# Patient Record
Sex: Male | Born: 1960
Health system: Southern US, Community
[De-identification: ages and names within clinical notes are randomized; demographics above are authoritative.]

## PROBLEM LIST (undated history)

## (undated) DIAGNOSIS — E119 Type 2 diabetes mellitus without complications: Secondary | ICD-10-CM

## (undated) DIAGNOSIS — E785 Hyperlipidemia, unspecified: Secondary | ICD-10-CM

## (undated) DIAGNOSIS — K921 Melena: Secondary | ICD-10-CM

## (undated) DIAGNOSIS — I82409 Acute embolism and thrombosis of unspecified deep veins of unspecified lower extremity: Secondary | ICD-10-CM

## (undated) HISTORY — PX: POLYPECTOMY: SHX149

## (undated) HISTORY — DX: Hyperlipidemia, unspecified: E78.5

## (undated) HISTORY — DX: Type 2 diabetes mellitus without complications: E11.9

## (undated) HISTORY — DX: Acute embolism and thrombosis of unspecified deep veins of unspecified lower extremity: I82.409

## (undated) HISTORY — DX: Melena: K92.1

---

## 2016-10-22 ENCOUNTER — Encounter: Payer: Self-pay | Admitting: Family Medicine

## 2016-10-22 ENCOUNTER — Ambulatory Visit (INDEPENDENT_AMBULATORY_CARE_PROVIDER_SITE_OTHER): Payer: BLUE CROSS/BLUE SHIELD | Admitting: Family Medicine

## 2016-10-22 VITALS — BP 134/79 | HR 84 | Temp 97.5°F | Ht 73.0 in | Wt 250.4 lb

## 2016-10-22 DIAGNOSIS — Z1383 Encounter for screening for respiratory disorder NEC: Secondary | ICD-10-CM

## 2016-10-22 DIAGNOSIS — R739 Hyperglycemia, unspecified: Secondary | ICD-10-CM

## 2016-10-22 DIAGNOSIS — Z125 Encounter for screening for malignant neoplasm of prostate: Secondary | ICD-10-CM

## 2016-10-22 DIAGNOSIS — Z1329 Encounter for screening for other suspected endocrine disorder: Secondary | ICD-10-CM

## 2016-10-22 DIAGNOSIS — Z0000 Encounter for general adult medical examination without abnormal findings: Secondary | ICD-10-CM

## 2016-10-22 DIAGNOSIS — Z136 Encounter for screening for cardiovascular disorders: Secondary | ICD-10-CM | POA: Diagnosis not present

## 2016-10-22 DIAGNOSIS — Z130 Encounter for screening for diseases of the blood and blood-forming organs and certain disorders involving the immune mechanism: Secondary | ICD-10-CM

## 2016-10-22 DIAGNOSIS — Z113 Encounter for screening for infections with a predominantly sexual mode of transmission: Secondary | ICD-10-CM

## 2016-10-22 DIAGNOSIS — Z1211 Encounter for screening for malignant neoplasm of colon: Secondary | ICD-10-CM | POA: Diagnosis not present

## 2016-10-22 DIAGNOSIS — Z23 Encounter for immunization: Secondary | ICD-10-CM | POA: Diagnosis not present

## 2016-10-22 DIAGNOSIS — E785 Hyperlipidemia, unspecified: Secondary | ICD-10-CM

## 2016-10-22 DIAGNOSIS — Z1389 Encounter for screening for other disorder: Secondary | ICD-10-CM

## 2016-10-22 DIAGNOSIS — Z1212 Encounter for screening for malignant neoplasm of rectum: Secondary | ICD-10-CM

## 2016-10-22 LAB — POCT URINALYSIS DIP (MANUAL ENTRY)
BILIRUBIN UA: NEGATIVE
BILIRUBIN UA: NEGATIVE
GLUCOSE UA: NEGATIVE
Leukocytes, UA: NEGATIVE
Nitrite, UA: NEGATIVE
Protein Ur, POC: NEGATIVE
RBC UA: NEGATIVE
Urobilinogen, UA: 0.2
pH, UA: 5.5

## 2016-10-22 NOTE — Progress Notes (Signed)
Subjective:  By signing my name below, I, Cameron James, attest that this documentation has been prepared under the direction and in the presence of Delman Cheadle, MD Electronically Signed: Ladene Artist, ED Scribe 10/22/2016 at 8:21 AM.   Patient ID: Cameron James, male    DOB: 1961/09/18, 57 y.o.   MRN: [id number]   Chief Complaint  Patient presents with  . Annual Exam    CPE   HPI HPI Comments: Cameron James is a 56 y.o. male, with no pertinent family hx, who presents to Primary Care at [facility] for an annual exam. Pt does not take any dietary supplements or vitamins. He does not have any dietary restrictions. Pt last had his cholesterol checked ~20 years ago and states that it was slightly elevated at that time. His mother has a h/o DM and HTN. No family h/o CA. Pt denies h/o tobacco use. Pt denies urinary frequency or weakened stream. Pt is a truck driver. Pt has never had a colonoscopy or cologuard done but agrees to schedule a colonoscopy. His last tetanus is unknown. Pt agrees to STD screening but states no risk.   History reviewed. No pertinent past medical history.   No current outpatient prescriptions on file prior to visit.   No current facility-administered medications on file prior to visit.    Allergies not on file   There is no immunization history on file for this patient.  History reviewed. No pertinent surgical history. Family History  Problem Relation Age of Onset  . Diabetes Mother   . Hypertension Mother   . Heart disease Father     Social History   Social History  . Marital status: Married    Spouse name: N/A  . Number of children: N/A  . Years of education: N/A   Occupational History  . Not on file.   Social History Main Topics  . Smoking status: Never Smoker  . Smokeless tobacco: Never Used  . Alcohol use 1.2 oz/week    1 Cans of beer, 1 Shots of liquor per week  . Drug use: No  . Sexual activity: Not on file   Other Topics Concern  . Not on file     Social History Narrative  . No narrative on file   Review of Systems  Genitourinary: Negative for difficulty urinating and frequency.  All other systems reviewed and are negative.     Objective:   Physical Exam  Constitutional: He is oriented to person, place, and time. He appears well-developed and well-nourished. No distress.  HENT:  Head: Normocephalic and atraumatic.  Right Ear: Tympanic membrane normal.  Left Ear: Tympanic membrane normal.  Nose: Nose normal.  Mouth/Throat: Oropharynx is clear and moist.  Eyes: Conjunctivae and EOM are normal.  Neck: Neck supple. No tracheal deviation present. No thyromegaly present.  Cardiovascular: Normal rate, regular rhythm, S1 normal, S2 normal and normal heart sounds.   Pulmonary/Chest: Effort normal and breath sounds normal. No respiratory distress.  Abdominal: Bowel sounds are normal. He exhibits no mass. There is no hepatosplenomegaly.  Genitourinary:  Genitourinary Comments: Chaperone present. Normal prostate. Normal rectum.   Musculoskeletal: Normal range of motion.  Soft subcutaneous well-defined fluctuant mass ~3 cm on the medial mid aspect of L calf. Nonmonbile. Not adherent to overlying skin. Negative straight leg raise.   Neurological: He is alert and oriented to person, place, and time.  Reflex Scores:      Patellar reflexes are 2+ on the right side and 2+ on the  left side.      Achilles reflexes are 2+ on the right side and 2+ on the left side. Skin: Skin is warm and dry.  Psychiatric: He has a normal mood and affect. His behavior is normal.  Nursing note and vitals reviewed.   BP 134/79 (BP Location: Right Arm, Patient Position: Sitting, Cuff Size: Large)   Pulse 84   Temp 97.5 F (36.4 C) (Oral)   Ht 6\' 1"  (1.854 m)   Wt 250 lb 6.4 oz (113.6 kg)   SpO2 96%   BMI 33.04 kg/m     Assessment & Plan:   1. Annual physical exam   2. Routine screening for STI (sexually transmitted infection)   3. Screening for  cardiovascular, respiratory, and genitourinary diseases   4. Screening for colorectal cancer   5. Screening for deficiency anemia   6. Screening for prostate cancer   7. Screening for thyroid disorder   8. Need for Tdap vaccination   Refuses flu shot.  Orders Placed This Encounter  Procedures  . GC/Chlamydia Probe Amp  . Tdap vaccine greater than or equal to 7yo IM  . CBC  . Comprehensive metabolic panel    Order Specific Question:   Has the patient fasted?    Answer:   Yes  . PSA  . TSH  . Lipid panel    Order Specific Question:   Has the patient fasted?    Answer:   Yes  . HIV antibody  . RPR  . Hepatitis c antibody (reflex)  . HCV Comment:  . Care order/instruction:    AVS - please print after vaccine is given and let pt go  . POCT urinalysis dipstick      I personally performed the services described in this documentation, which was scribed in my presence. The recorded information has been reviewed and considered, and addended by me as needed.   Delman Cheadle, M.D.  [facility] [address] [place], [place] [address] [phone] phone [phone] fax  10/23/16 10:34 AM

## 2016-10-22 NOTE — Patient Instructions (Signed)

## 2016-10-24 ENCOUNTER — Encounter: Payer: Self-pay | Admitting: Gastroenterology

## 2016-10-24 LAB — GC/CHLAMYDIA PROBE AMP
CHLAMYDIA, DNA PROBE: NEGATIVE
NEISSERIA GONORRHOEAE BY PCR: NEGATIVE

## 2016-10-25 LAB — CBC
Hematocrit: 46.3 % (ref 37.5–51.0)
Hemoglobin: 16.4 g/dL (ref 13.0–17.7)
MCH: 30.3 pg (ref 26.6–33.0)
MCHC: 35.4 g/dL (ref 31.5–35.7)
MCV: 86 fL (ref 79–97)
PLATELETS: 195 10*3/uL (ref 150–379)
RBC: 5.41 x10E6/uL (ref 4.14–5.80)
RDW: 13.3 % (ref 12.3–15.4)
WBC: 7.6 10*3/uL (ref 3.4–10.8)

## 2016-10-25 LAB — TSH: TSH: 3.78 u[IU]/mL (ref 0.450–4.500)

## 2016-10-25 LAB — COMPREHENSIVE METABOLIC PANEL

## 2016-10-25 LAB — HCV COMMENT:

## 2016-10-25 LAB — LIPID PANEL

## 2016-10-25 LAB — PSA: PROSTATE SPECIFIC AG, SERUM: 1.4 ng/mL (ref 0.0–4.0)

## 2016-10-25 LAB — RPR: RPR: NONREACTIVE

## 2016-10-25 LAB — HEPATITIS C ANTIBODY (REFLEX)

## 2016-10-25 LAB — HIV ANTIBODY (ROUTINE TESTING W REFLEX): HIV Screen 4th Generation wRfx: NONREACTIVE

## 2016-10-29 LAB — COMPREHENSIVE METABOLIC PANEL
A/G RATIO: 1.5 (ref 1.2–2.2)
ALBUMIN: 4.1 g/dL (ref 3.5–5.5)
ALT: 37 IU/L (ref 0–44)
AST: 22 IU/L (ref 0–40)
Alkaline Phosphatase: 70 IU/L (ref 39–117)
BILIRUBIN TOTAL: 0.3 mg/dL (ref 0.0–1.2)
BUN / CREAT RATIO: 15 (ref 9–20)
BUN: 17 mg/dL (ref 6–24)
CALCIUM: 9.2 mg/dL (ref 8.7–10.2)
CHLORIDE: 102 mmol/L (ref 96–106)
CO2: 24 mmol/L (ref 18–29)
Creatinine, Ser: 1.11 mg/dL (ref 0.76–1.27)
GFR, EST AFRICAN AMERICAN: 87 mL/min/{1.73_m2} (ref >59)
GFR, EST NON AFRICAN AMERICAN: 75 mL/min/{1.73_m2} (ref >59)
GLOBULIN, TOTAL: 2.8 g/dL (ref 1.5–4.5)
Glucose: 174 mg/dL — ABNORMAL HIGH (ref 65–99)
POTASSIUM: 4 mmol/L (ref 3.5–5.2)
SODIUM: 142 mmol/L (ref 134–144)
TOTAL PROTEIN: 6.9 g/dL (ref 6.0–8.5)

## 2016-10-29 LAB — LIPID PANEL
CHOL/HDL RATIO: 7.6 ratio — AB (ref 0.0–5.0)
Cholesterol, Total: 289 mg/dL — ABNORMAL HIGH (ref 100–199)
HDL: 38 mg/dL — ABNORMAL LOW (ref >39)
LDL Calculated: 214 mg/dL — ABNORMAL HIGH (ref 0–99)
Triglycerides: 187 mg/dL — ABNORMAL HIGH (ref 0–149)
VLDL Cholesterol Cal: 37 mg/dL (ref 5–40)

## 2016-11-15 ENCOUNTER — Telehealth: Payer: Self-pay

## 2016-11-15 MED ORDER — PRAVASTATIN SODIUM 40 MG PO TABS: 40.0000 mg | ORAL_TABLET | Freq: Every day | ORAL | 0 refills | Status: DC

## 2016-11-15 NOTE — Telephone Encounter (Signed)
PATIENT STATES HE WAS HERE ABOUT 3 WEEKS AGO AND HE HAD A COMPLETE PHYSICAL DONE WITH DR. SHAW. SOMEONE CALLED HIM TO SAY HE NEEDED TO COME BACK IN TO GIVE MORE BLOOD. HE DID THAT BUT HE HAS NEVER GOTTEN HIS LAB RESULTS. BEST PHONE [phone] (CELL) PHARMACY CHOICE IS [facility] [place]. [place]

## 2016-11-15 NOTE — Addendum Note (Signed)
Addended by: Delman Cheadle on: 11/15/2016 09:32 PM   Modules accepted: Orders

## 2016-11-15 NOTE — Telephone Encounter (Signed)
Please see labs, all resulted? And advise

## 2016-11-15 NOTE — Telephone Encounter (Signed)
Was he fasting since midnight the night prior to his second blood draw?    His blood counts, prostate, and thyroid blood tests were all normal. He does not have HIV, syphilis, hepatitis C, chlamydia, or gonorrhea.  However, the lab for some reason cancelled his first blood sugar and cholesterol and his second - at the redraw - were much to high. Needs to start on cholesterol medication - pravastatin - which I sent to his pharmacy and come back in for an OV asap. We will be able to get the diabetes test back during his office visit and discuss details at that time.

## 2016-11-16 ENCOUNTER — Telehealth: Payer: Self-pay

## 2016-11-16 NOTE — Telephone Encounter (Signed)
[facility] [place], [place]

## 2016-11-16 NOTE — Telephone Encounter (Signed)
Pt calling in about lab results. Relayed msg from Dr Brigitte Pulse. Wants more clarification on why he has an rx called in as well as his results. [facility] [place] as he is traveling/working as a Administrator.

## 2016-11-18 NOTE — Telephone Encounter (Signed)
lmtcb for appt or response

## 2016-11-19 ENCOUNTER — Ambulatory Visit (INDEPENDENT_AMBULATORY_CARE_PROVIDER_SITE_OTHER): Payer: BLUE CROSS/BLUE SHIELD | Admitting: Family Medicine

## 2016-11-19 VITALS — BP 132/92 | HR 88 | Temp 98.3°F | Resp 16 | Ht 73.0 in | Wt 247.0 lb

## 2016-11-19 DIAGNOSIS — E119 Type 2 diabetes mellitus without complications: Secondary | ICD-10-CM

## 2016-11-19 DIAGNOSIS — E785 Hyperlipidemia, unspecified: Secondary | ICD-10-CM

## 2016-11-19 DIAGNOSIS — R7309 Other abnormal glucose: Secondary | ICD-10-CM

## 2016-11-19 DIAGNOSIS — Z5181 Encounter for therapeutic drug level monitoring: Secondary | ICD-10-CM

## 2016-11-19 LAB — POCT GLYCOSYLATED HEMOGLOBIN (HGB A1C): Hemoglobin A1C: 7.2

## 2016-11-19 MED ORDER — BLOOD GLUCOSE MONITOR KIT: PACK | 0 refills | Status: DC

## 2016-11-19 MED ORDER — ASPIRIN EC 81 MG PO TBEC: 81.0000 mg | DELAYED_RELEASE_TABLET | Freq: Every day | ORAL | Status: DC

## 2016-11-19 MED ORDER — METFORMIN HCL 500 MG PO TABS: 500.0000 mg | ORAL_TABLET | Freq: Every day | ORAL | 3 refills | Status: DC

## 2016-11-19 NOTE — Patient Instructions (Addendum)
IF you received an x-ray today, you will receive an invoice from [facility]. Please contact [facility] at [phone] with questions or concerns regarding your invoice.   IF you received labwork today, you will receive an invoice from [place]. Please contact [facility] at [phone] with questions or concerns regarding your invoice.   Our billing staff will not be able to assist you with questions regarding bills from these companies.  You will be contacted with the lab results as soon as they are available. The fastest way to get your results is to activate your My Chart account. Instructions are located on the last page of this paperwork. If you have not heard from [place] regarding the results in 2 weeks, please contact this office.     Type 2 Diabetes Mellitus, Diagnosis, Adult Type 2 diabetes (type 2 diabetes mellitus) is a long-term (chronic) disease. In type 2 diabetes, one or both of these problems may be present:  The pancreas does not make enough of a hormone called insulin.  Cells in the body do not respond properly to insulin that the body makes (insulin resistance). Normally, insulin allows blood sugar (glucose) to enter cells in the body. The cells use glucose for energy. Insulin resistance or lack of insulin causes excess glucose to build up in the blood instead of going into cells. As a result, high blood glucose (hyperglycemia) develops. What increases the risk? The following factors may make you more likely to develop type 2 diabetes:  Having a family member with type 2 diabetes.  Being overweight or obese.  Having an inactive (sedentary) lifestyle.  Having been diagnosed with insulin resistance.  Having a history of prediabetes, gestational diabetes, or polycystic ovarian syndrome (PCOS).  Being of American-Indian, African-American, Hispanic/Latino, or Asian/Pacific Islander descent. What are the signs or symptoms? In the early stage of this  condition, you may not have symptoms. Symptoms develop slowly and may include:  Increased thirst (polydipsia).  Increased hunger(polyphagia).  Increased urination (polyuria).  Increased urination during the night (nocturia).  Unexplained weight loss.  Frequent infections that keep coming back (recurring).  Fatigue.  Weakness.  Vision changes, such as blurry vision.  Cuts or bruises that are slow to heal.  Tingling or numbness in the hands or feet.  Dark patches on the skin (acanthosis nigricans). How is this diagnosed?   This condition is diagnosed based on your symptoms, your medical history, a physical exam, and your blood glucose level. Your blood glucose may be checked with one or more of the following blood tests:  A fasting blood glucose (FBG) test. You will not be allowed to eat (you will fast) for at least 8 hours before a blood sample is taken.  A random blood glucose test. This checks blood glucose at any time of day regardless of when you ate.  An A1c (hemoglobin A1c) blood test. This provides information about blood glucose control over the previous 2-3 months.  An oral glucose tolerance test (OGTT). This measures your blood glucose at two times:  After fasting. This is your baseline blood glucose level.  Two hours after drinking a beverage that contains glucose. You may be diagnosed with type 2 diabetes if:  Your FBG level is 126 mg/dL (7.0 mmol/L) or higher.  Your random blood glucose level is 200 mg/dL (11.1 mmol/L) or higher.  Your A1c level is 6.5% or higher.  Your OGGT result is higher than 200 mg/dL (11.1 mmol/L). These blood tests may be repeated  to confirm your diagnosis. How is this treated? Your treatment may be managed by a specialist called an endocrinologist. Type 2 diabetes may be treated by following instructions from your health care provider about:  Making diet and lifestyle changes. This may include:  Following an individualized  nutrition plan that is developed by a diet and nutrition specialist (registered dietitian).  Exercising regularly.  Finding ways to manage stress.  Checking your blood glucose level as often as directed.  Taking diabetes medicines or insulin daily. This helps to keep your blood glucose levels in the healthy range.  If you use insulin, you may need to adjust the dosage depending on how physically active you are and what foods you eat. Your health care provider will tell you how to adjust your dosage.  Taking medicines to help prevent complications from diabetes, such as:  Aspirin.  Medicine to lower cholesterol.  Medicine to control blood pressure. Your health care provider will set individualized treatment goals for you. Your goals will be based on your age, other medical conditions you have, and how you respond to diabetes treatment. Generally, the goal of treatment is to maintain the following blood glucose levels:  Before meals (preprandial): 80-130 mg/dL (4.4-7.2 mmol/L).  After meals (postprandial): below 180 mg/dL (10 mmol/L).  A1c level: less than 7%. Follow these instructions at home: Questions to [place] Provider  Consider asking the following questions:  Do I need to meet with a diabetes educator?  Where can I find a support group for people with diabetes?  What equipment will I need to manage my diabetes at home?  What diabetes medicines do I need, and when should I take them?  How often do I need to check my blood glucose?  What number can I call if I have questions?  When is my next appointment? General instructions  Take over-the-counter and prescription medicines only as told by your health care provider.  Keep all follow-up visits as told by your health care provider. This is important.  For more information about diabetes, visit:  American Diabetes Association (ADA): www.diabetes.org  American Association of Diabetes Educators (AADE):  www.diabeteseducator.org/patient-resources Contact a health care provider if:  Your blood glucose is at or above 240 mg/dL (13.3 mmol/L) for 2 days in a row.  You have been sick or have had a fever for 2 days or longer and you are not getting better.  You have any of the following problems for more than 6 hours:  You cannot eat or drink.  You have nausea and vomiting.  You have diarrhea. Get help right away if:  Your blood glucose is lower than 54 mg/dL (3.0 mmol/L).  You become confused or you have trouble thinking clearly.  You have difficulty breathing.  You have moderate or large ketone levels in your urine. This information is not intended to replace advice given to you by your health care provider. Make sure you discuss any questions you have with your health care provider. Document Released: 09/30/2005 Document Revised: 03/07/2016 Document Reviewed: 11/03/2015 Elsevier Interactive Patient Education  2017 Elsevier Inc. Diabetes Mellitus and Food It is important for you to manage your blood sugar (glucose) level. Your blood glucose level can be greatly affected by what you eat. Eating healthier foods in the appropriate amounts throughout the day at about the same time each day will help you control your blood glucose level. It can also help slow or prevent worsening of your diabetes mellitus. Healthy eating  may even help you improve the level of your blood pressure and reach or maintain a healthy weight. General recommendations for healthful eating and cooking habits include:  Eating meals and snacks regularly. Avoid going long periods of time without eating to lose weight.  Eating a diet that consists mainly of plant-based foods, such as fruits, vegetables, nuts, legumes, and whole grains.  Using low-heat cooking methods, such as baking, instead of high-heat cooking methods, such as deep frying. Work with your dietitian to make sure you understand how to use the Nutrition  Facts information on food labels. How can food affect me? Carbohydrates  Carbohydrates affect your blood glucose level more than any other type of food. Your dietitian will help you determine how many carbohydrates to eat at each meal and teach you how to count carbohydrates. Counting carbohydrates is important to keep your blood glucose at a healthy level, especially if you are using insulin or taking certain medicines for diabetes mellitus. Alcohol  Alcohol can cause sudden decreases in blood glucose (hypoglycemia), especially if you use insulin or take certain medicines for diabetes mellitus. Hypoglycemia can be a life-threatening condition. Symptoms of hypoglycemia (sleepiness, dizziness, and disorientation) are similar to symptoms of having too much alcohol. If your health care provider has given you approval to drink alcohol, do so in moderation and use the following guidelines:  Women should not have more than one drink per day, and men should not have more than two drinks per day. One drink is equal to:  12 oz of beer.  5 oz of wine.  1 oz of hard liquor.  Do not drink on an empty stomach.  Keep yourself hydrated. Have water, diet soda, or unsweetened iced tea.  Regular soda, juice, and other mixers might contain a lot of carbohydrates and should be counted. What foods are not recommended? As you make food choices, it is important to remember that all foods are not the same. Some foods have fewer nutrients per serving than other foods, even though they might have the same number of calories or carbohydrates. It is difficult to get your body what it needs when you eat foods with fewer nutrients. Examples of foods that you should avoid that are high in calories and carbohydrates but low in nutrients include:  Trans fats (most processed foods list trans fats on the Nutrition Facts label).  Regular soda.  Juice.  Candy.  Sweets, such as cake, pie, doughnuts, and cookies.  Fried  foods. What foods can I eat? Eat nutrient-rich foods, which will nourish your body and keep you healthy. The food you should eat also will depend on several factors, including:  The calories you need.  The medicines you take.  Your weight.  Your blood glucose level.  Your blood pressure level.  Your cholesterol level. You should eat a variety of foods, including:  Protein.  Lean cuts of meat.  Proteins low in saturated fats, such as fish, egg whites, and beans. Avoid processed meats.  Fruits and vegetables.  Fruits and vegetables that may help control blood glucose levels, such as apples, mangoes, and yams.  Dairy products.  Choose fat-free or low-fat dairy products, such as milk, yogurt, and cheese.  Grains, bread, pasta, and rice.  Choose whole grain products, such as multigrain bread, whole oats, and brown rice. These foods may help control blood pressure.  Fats.  Foods containing healthful fats, such as nuts, avocado, olive oil, canola oil, and fish. Does everyone with diabetes mellitus  have the same meal plan? Because every person with diabetes mellitus is different, there is not one meal plan that works for everyone. It is very important that you meet with a dietitian who will help you create a meal plan that is just right for you. This information is not intended to replace advice given to you by your health care provider. Make sure you discuss any questions you have with your health care provider. Document Released: 06/27/2005 Document Revised: 03/07/2016 Document Reviewed: 08/27/2013 Elsevier Interactive Patient Education  2017 [place] for Eating Away From Home If You Have Diabetes Controlling your level of blood glucose, also known as blood sugar, can be challenging. It can be even more difficult when you do not prepare your own meals. The following tips can help you manage your diabetes when you eat away from home. Planning ahead Plan ahead if  you know you will be eating away from home:  Ask your health care provider how to time meals and medicine if you are taking insulin.  Make a list of restaurants near you that offer healthy choices. If they have a carry-out menu, take it home and plan what you will order ahead of time.  Look up the restaurant you want to eat at online. Many chain and fast-food restaurants list nutritional information online. Use this information to choose the healthiest options and to calculate how many carbohydrates will be in your meal.  Use a carbohydrate-counting book or mobile app to look up the carbohydrate content and serving size of the foods you want to eat.  Become familiar with serving sizes and learn to recognize how many servings are in a portion. This will allow you to estimate how many carbohydrates you can eat. Free foods A "free food" is any food or drink that has less than 5 g of carbohydrates per serving. Free foods include:  Many vegetables.  Hard boiled eggs.  Nuts or seeds.  Olives.  Cheeses.  Meats. These types of foods make good appetizer choices and are often available at salad bars. Lemon juice, vinegar, or a low-calorie salad dressing of fewer than 20 calories per serving can be used as a "free" salad dressing. Choices to reduce carbohydrates  Substitute nonfat sweetened yogurt with a sugar-free yogurt. Yogurt made from soy milk may also be used, but you will still want a sugar-free or plain option to choose a lower carbohydrate amount.  Ask your server to take away the bread basket or chips from your table.  Order fresh fruit. A salad bar often offers fresh fruit choices. Avoid canned fruit because it is usually packed in sugar or syrup.  Order a salad, and eat it without dressing. Or, create a "free" salad dressing.  Ask for substitutions. For example, instead of [place] fries, request an order of a vegetable such as salad, green beans, or broccoli. Other tips  If you  take insulin, take the insulin once your food arrives to your table. This will ensure your insulin and food are timed correctly.  Ask your server about the portion size before your order, and ask for a take-out box if the portion has more servings than you should have. When your food comes, leave the amount you should have on the plate, and put the rest in the take-out box.  Consider splitting an entree with someone and ordering a side salad. This information is not intended to replace advice given to you by your health care provider. Make sure  you discuss any questions you have with your health care provider. Document Released: 09/30/2005 Document Revised: 03/07/2016 Document Reviewed: 12/28/2013 Elsevier Interactive Patient Education  2017 Elsevier Inc.  Preventing Type 2 Diabetes Mellitus Type 2 diabetes (type 2 diabetes mellitus) is a long-term (chronic) disease that affects blood sugar (glucose) levels. Normally, a hormone called insulin allows glucose to enter cells in the body. The cells use glucose for energy. In type 2 diabetes, one or both of these problems may be present:  The body does not make enough insulin.  The body does not respond properly to insulin that it makes (insulin resistance). Insulin resistance or lack of insulin causes excess glucose to build up in the blood instead of going into cells. As a result, high blood glucose (hyperglycemia) develops, which can cause many complications. Being overweight or obese and having an inactive (sedentary) lifestyle can increase your risk for diabetes. Type 2 diabetes can be delayed or prevented by making certain nutrition and lifestyle changes. What nutrition changes can be made?  Eat healthy meals and snacks regularly. Keep a healthy snack with you for when you get hungry between meals, such as fruit or a handful of nuts.  Eat lean meats and proteins that are low in saturated fats, such as chicken, fish, egg whites, and beans. Avoid  processed meats.  Eat plenty of fruits and vegetables and plenty of grains that have not been processed (whole grains). It is recommended that you eat:  1?2 cups of fruit every day.  2?3 cups of vegetables every day.  6?8 oz of whole grains every day, such as oats, whole wheat, bulgur, brown rice, quinoa, and millet.  Eat low-fat dairy products, such as milk, yogurt, and cheese.  Eat foods that contain healthy fats, such as nuts, avocado, olive oil, and canola oil.  Drink water throughout the day. Avoid drinks that contain added sugar, such as soda or sweet tea.  Follow instructions from your health care provider about specific eating or drinking restrictions.  Control how much food you eat at a time (portion size).  Check food labels to find out the serving sizes of foods.  Use a kitchen scale to weigh amounts of foods.  Saute or steam food instead of frying it. Cook with water or broth instead of oils or butter.  Limit your intake of:  Salt (sodium). Have no more than 1 tsp (2,400 mg) of sodium a day. If you have heart disease or high blood pressure, have less than ? tsp (1,500 mg) of sodium a day.  Saturated fat. This is fat that is solid at room temperature, such as butter or fat on meat. What lifestyle changes can be made?  Activity  Do moderate-intensity physical activity for at least 30 minutes on at least 5 days of the week, or as much as told by your health care provider.  Ask your health care provider what activities are safe for you. A mix of physical activities may be best, such as walking, swimming, cycling, and strength training.  Try to add physical activity into your day. For example:  Park in spots that are farther away than usual, so that you walk more. For example, park in a far corner of the parking lot when you go to the office or the grocery store.  Take a walk during your lunch break.  Use stairs instead of elevators or escalators. Weight  Loss  Lose weight as directed. Your health care provider can determine how much weight  loss is best for you and can help you lose weight safely.  If you are overweight or obese, you may be instructed to lose at least 5?7 % of your body weight. Alcohol and Tobacco   Limit alcohol intake to no more than 1 drink a day for nonpregnant women and 2 drinks a day for men. One drink equals 12 oz of beer, 5 oz of wine, or 1 oz of hard liquor.  Do not use any tobacco products, such as cigarettes, chewing tobacco, and e-cigarettes. If you need help quitting, ask your health care provider. Work With Your Health Care Provider  Have your blood glucose tested regularly, as told by your health care provider.  Discuss your risk factors and how you can reduce your risk for diabetes.  Get screening tests as told by your health care provider. You may have screening tests regularly, especially if you have certain risk factors for type 2 diabetes.  Make an appointment with a diet and nutrition specialist (registered dietitian). A registered dietitian can help you make a healthy eating plan and can help you understand portion sizes and food labels. Why are these changes important?  It is possible to prevent or delay type 2 diabetes and related health problems by making lifestyle and nutrition changes.  It can be difficult to recognize signs of type 2 diabetes. The best way to avoid possible damage to your body is to take actions to prevent the disease before you develop symptoms. What can happen if changes are not made?  Your blood glucose levels may keep increasing. Having high blood glucose for a long time is dangerous. Too much glucose in your blood can damage your blood vessels, heart, kidneys, nerves, and eyes.  You may develop prediabetes or type 2 diabetes. Type 2 diabetes can lead to many chronic health problems and complications, such as:  Heart disease.  Stroke.  Blindness.  Kidney  disease.  Depression.  Poor circulation in the feet and legs, which could lead to surgical removal (amputation) in severe cases. Where to find support:  Ask your health care provider to recommend a registered dietitian, diabetes educator, or weight loss program.  Look for local or online weight loss groups.  Join a gym, fitness club, or outdoor activity group, such as a walking club. Where to find more information: To learn more about diabetes and diabetes prevention, visit:  American Diabetes Association (ADA): www.diabetes.[employer] of Diabetes and Digestive and Kidney Diseases: ToyArticles.ca To learn more about healthy eating, visit:  The U.S. Department of Agriculture Architect), Choose My Plate: http://yates.biz/  Office of Disease Prevention and Health Promotion (ODPHP), Dietary Guidelines: ListingMagazine.si Summary  You can reduce your risk for type 2 diabetes by increasing your physical activity, eating healthy foods, and losing weight as directed.  Talk with your health care provider about your risk for type 2 diabetes. Ask about any blood tests or screening tests that you need to have. This information is not intended to replace advice given to you by your health care provider. Make sure you discuss any questions you have with your health care provider. Document Released: 01/22/2016 Document Revised: 03/07/2016 Document Reviewed: 11/21/2015 Elsevier Interactive Patient Education  2017 Elsevier Inc.  Diabetes Mellitus and Standards of Medical Care Managing diabetes (diabetes mellitus) can be complicated. Your diabetes treatment may be managed by a team of health care providers, including:  A diet and nutrition specialist (registered dietitian).  A nurse.  A certified diabetes educator (CDE).  A diabetes specialist (endocrinologist).  An eye doctor.  A primary care provider.  A  dentist. Your health care providers follow a schedule in order to help you get the best quality of care. The following schedule is a general guideline for your diabetes management plan. Your health care providers may also give you more specific instructions. HbA1c ( hemoglobin A1c) test This test provides information about blood sugar (glucose) control over the previous 2-3 months. It is used to check whether your diabetes management plan needs to be adjusted.  If you are meeting your treatment goals, this test is done at least 2 times a year.  If you are not meeting treatment goals or if your treatment goals have changed, this test is done 4 times a year. Blood pressure test  This test is done at every routine medical visit. For most people, the goal is less than 140/90. In some cases, your goal blood pressure may be 130/80 or less. Ask your health care provider what your goal blood pressure should be. Dental and eye exams  Visit your dentist two times a year.  If you have type 1 diabetes, get an eye exam 3-5 years after you are diagnosed, and then once a year after your first exam.  If you were diagnosed with type 1 diabetes as a child, get an eye exam when you are age 58 or older and have had diabetes for 3-5 years. After the first exam, you should get an eye exam once a year.  If you have type 2 diabetes, have an eye exam as soon as you are diagnosed, and then once a year after your first exam. Foot care exam  Visual foot exams are done at every routine medical visit. The exams check for cuts, bruises, redness, blisters, sores, or other problems with the feet.  A complete foot exam is done by your health care provider once a year. This exam includes an inspection of the structure and skin of your feet, and a check of the pulses and sensation in your feet.  Type 1 diabetes: Get your first exam 3-5 years after diagnosis.  Type 2 diabetes: Get your first exam as soon as you are  diagnosed.  Check your feet every day for cuts, bruises, redness, blisters, or sores. If you have any of these or other problems that are not healing, contact your health care provider. Kidney function test ( urine microalbumin)  This test is done once a year.  Type 1 diabetes: Get your first test 5 years after diagnosis.  Type 2 diabetes: Get your first test as soon as you are diagnosed.  If you have chronic kidney disease (CKD), get a serum creatinine and estimated glomerular filtration rate (eGFR) test once a year. Lipid profile (cholesterol, HDL, LDL, triglycerides)  This test should be done when you are diagnosed with diabetes, and every 5 years after the first test. If you are on medicines to lower your cholesterol, you may need to get this test done every year.  The goal for LDL is less than 100 mg/dL (5.5 mmol/L). If you are at high risk, the goal is less than 70 mg/dL (3.9 mmol/L).  The goal for HDL is 40 mg/dL (2.2 mmol/L) for men and 50 mg/dL(2.8 mmol/L) for women. An HDL cholesterol of 60 mg/dL (3.3 mmol/L) or higher gives some protection against heart disease.  The goal for triglycerides is less than 150 mg/dL (8.3 mmol/L). Immunizations  The yearly flu (influenza) vaccine is recommended  for everyone 6 months or older who has diabetes.  The pneumonia (pneumococcal) vaccine is recommended for everyone 2 years or older who has diabetes. If you are 17 or older, you may get the pneumonia vaccine as a series of two separate shots.  The hepatitis B vaccine is recommended for adults shortly after they have been diagnosed with diabetes.  The Tdap (tetanus, diphtheria, and pertussis) vaccine should be given:  According to normal childhood vaccination schedules, for children.  Every 10 years, for adults who have diabetes.  The shingles vaccine is recommended for people who have had chicken pox and are 50 years or older. Mental and emotional health  Screening for symptoms of  eating disorders, anxiety, and depression is recommended at the time of diagnosis and afterward as needed. If your screening shows that you have symptoms (you have a positive screening result), you may need further evaluation and be referred to a mental health care provider. Diabetes self-management education  Education about how to manage your diabetes is recommended at diagnosis and ongoing as needed. Treatment plan  Your treatment plan will be reviewed at every medical visit. Summary  Managing diabetes (diabetes mellitus) can be complicated. Your diabetes treatment may be managed by a team of health care providers.  Your health care providers follow a schedule in order to help you get the best quality of care.  Standards of care including having regular physical exams, blood tests, blood pressure monitoring, immunizations, screening tests, and education about how to manage your diabetes.  Your health care providers may also give you more specific instructions based on your individual health. This information is not intended to replace advice given to you by your health care provider. Make sure you discuss any questions you have with your health care provider. Document Released: 07/28/2009 Document Revised: 06/28/2016 Document Reviewed: 06/28/2016 Elsevier Interactive Patient Education  2017 [employer].   Blood Glucose Monitoring, Adult Monitoring your blood sugar (glucose) helps you manage your diabetes. It also helps you and your health care provider determine how well your diabetes management plan is working. Blood glucose monitoring involves checking your blood glucose as often as directed, and keeping a record (log) of your results over time. Why should I monitor my blood glucose? Checking your blood glucose regularly can:  Help you understand how food, exercise, illnesses, and medicines affect your blood glucose.  Let you know what your blood glucose is at any time. You can  quickly tell if you are having low blood glucose (hypoglycemia) or high blood glucose (hyperglycemia).  Help you and your health care provider adjust your medicines as needed. When should I check my blood glucose? Follow instructions from your health care provider about how often to check your blood glucose. This may depend on:  The type of diabetes you have.  How well-controlled your diabetes is.  Medicines you are taking. If you have type 1 diabetes:  Check your blood glucose at least 2 times a day.  Also check your blood glucose:  Before every insulin injection.  Before and after exercise.  Between meals.  2 hours after a meal.  Occasionally between 2:00 a.m. and 3:00 a.m., as directed.  Before potentially dangerous tasks, like driving or using heavy machinery.  At bedtime.  You may need to check your blood glucose more often, up to 6-10 times a day:  If you use an insulin pump.  If you need multiple daily injections (MDI).  If your diabetes is not well-controlled.  If you are ill.  If you have a history of severe hypoglycemia.  If you have a history of not knowing when your blood glucose is getting low (hypoglycemia unawareness). If you have type 2 diabetes:  If you take insulin or other diabetes medicines, check your blood glucose at least 2 times a day.  If you are on intensive insulin therapy, check your blood glucose at least 4 times a day. Occasionally, you may also need to check between 2:00 a.m. and 3:00 a.m., as directed.  Also check your blood glucose:  Before and after exercise.  Before potentially dangerous tasks, like driving or using heavy machinery.  You may need to check your blood glucose more often if:  Your medicine is being adjusted.  Your diabetes is not well-controlled.  You are ill. What is a blood glucose log?  A blood glucose log is a record of your blood glucose readings. It helps you and your health care provider:  Look  for patterns in your blood glucose over time.  Adjust your diabetes management plan as needed.  Every time you check your blood glucose, write down your result and notes about things that may be affecting your blood glucose, such as your diet and exercise for the day.  Most glucose meters store a record of glucose readings in the meter. Some meters allow you to download your records to a computer. How do I check my blood glucose? Follow these steps to get accurate readings of your blood glucose: Supplies needed   Blood glucose meter.  Test strips for your meter. Each meter has its own strips. You must use the strips that come with your meter.  A needle to prick your finger (lancet). Do not use lancets more than once.  A device that holds the lancet (lancing device).  A journal or log book to write down your results. Procedure  Wash your hands with soap and water.  Prick the side of your finger (not the tip) with the lancet. Use a different finger each time.  Gently rub the finger until a small drop of blood appears.  Follow instructions that come with your meter for inserting the test strip, applying blood to the strip, and using your blood glucose meter.  Write down your result and any notes. Alternative testing sites  Some meters allow you to use areas of your body other than your finger (alternative sites) to test your blood.  If you think you may have hypoglycemia, or if you have hypoglycemia unawareness, do not use alternative sites. Use your finger instead.  Alternative sites may not be as accurate as the fingers, because blood flow is slower in these areas. This means that the result you get may be delayed, and it may be different from the result that you would get from your finger.  The most common alternative sites are:  Forearm.  Thigh.  Palm of the hand. Additional tips  Always keep your supplies with you.  If you have questions or need help, all blood  glucose meters have a 24-hour "hotline" number that you can call. You may also contact your health care provider.  After you use a few boxes of test strips, adjust (calibrate) your blood glucose meter by following instructions that came with your meter. This information is not intended to replace advice given to you by your health care provider. Make sure you discuss any questions you have with your health care provider. Document Released: 10/03/2003 Document Revised: 04/19/2016  Document Reviewed: 03/11/2016 Elsevier Interactive Patient Education  2017 [employer].

## 2016-11-19 NOTE — Progress Notes (Signed)
Subjective:  By signing my name below, I, [name], attest that this documentation has been prepared under the direction and in the presence of Delman Cheadle, MD Electronically Signed: Ladene Artist, ED Scribe 11/19/2016 at 11:05 AM.   Patient ID: Cameron James, male    DOB: [birth year], 56 y.o.   MRN: [id number]  Chief Complaint  Patient presents with  . Follow-up    check A1c   HPI Cameron James is a 56 y.o. male who presents to Primary Care at [facility] for a follow-up. Pt is fasting at this visit. He has started the pravastatin. Pt states that his diet is average when it comes to carbohydrates but is not against cutting back on them. He states that he used to walk a lot for exercise but has stopped, however, he plans to restart exercising. Pt reports a family hx of DM.   No past medical history on file.   Current Outpatient Prescriptions on File Prior to Visit  Medication Sig Dispense Refill  . pravastatin (PRAVACHOL) 40 MG tablet Take 1 tablet (40 mg total) by mouth daily. 90 tablet 0   No current facility-administered medications on file prior to visit.    No Known Allergies  Social History   Social History  . Marital status: Married    Spouse name: N/A  . Number of children: N/A  . Years of education: N/A   Social History Main Topics  . Smoking status: Never Smoker  . Smokeless tobacco: Never Used  . Alcohol use 1.2 oz/week    1 Cans of beer, 1 Shots of liquor per week  . Drug use: No  . Sexual activity: Not Asked   Other Topics Concern  . None   Social History Narrative  . None   Depression screen [facility] 2/9 11/19/2016 10/22/2016  Decreased Interest 0 0  Down, Depressed, Hopeless 0 0  PHQ - 2 Score 0 0    Review of Systems  Constitutional: Negative for activity change, appetite change, chills and fever.  Eyes: Negative for visual disturbance.  Respiratory: Negative for shortness of breath.   Cardiovascular: Negative for chest pain and leg swelling.    Gastrointestinal: Negative for nausea and vomiting.  Endocrine: Negative for polydipsia, polyphagia and polyuria.  Genitourinary: Negative for frequency and urgency.  Neurological: Negative for dizziness, syncope, facial asymmetry, weakness, light-headedness and numbness.      Objective:   Physical Exam  Constitutional: He is oriented to person, place, and time. He appears well-developed and well-nourished. No distress.  HENT:  Head: Normocephalic and atraumatic.  Eyes: Conjunctivae and EOM are normal.  Neck: Neck supple. No tracheal deviation present.  Cardiovascular: Normal rate.   Pulmonary/Chest: Effort normal. No respiratory distress.  Musculoskeletal: Normal range of motion.  Neurological: He is alert and oriented to person, place, and time.  Skin: Skin is warm and dry.  Psychiatric: He has a normal mood and affect. His behavior is normal.  Nursing note and vitals reviewed.  BP (!) 132/92 (BP Location: Right Arm, Patient Position: Standing, Cuff Size: Large)   Pulse 88   Temp 98.3 F (36.8 C) (Oral)   Resp 16   Ht _0  (1.854 m)   Wt 247 lb (112 kg)   SpO2 94%   BMI 32.59 kg/m     Results for orders placed or performed in visit on 11/19/16  POCT glycosylated hemoglobin (Hb A1C)  Result Value Ref Range   Hemoglobin A1C 7.2    Assessment & Plan:  1. Hyperlipidemia, unspecified hyperlipidemia type   2. Elevated glucose   3. Medication monitoring encounter   4. Type 2 diabetes mellitus without complication, without long-term current use of insulin (HCC)   New diagnosis DM today - reviewed how and when to check cbgs, how to interpret #s, diet. Pt declines DM education referral but will call if he changes his mind - I gave him numerous handouts and he plans to use the ADA website and other online tools. Start metformin. Recheck 3 mos.  Orders Placed This Encounter  Procedures  . Comprehensive metabolic panel    Order Specific Question:   Has the patient fasted?     Answer:   Yes  . Lipid panel    Order Specific Question:   Has the patient fasted?    Answer:   Yes  . POCT glycosylated hemoglobin (Hb A1C)    Meds ordered this encounter  Medications  . blood glucose meter kit and supplies KIT    Sig: Dispense based on patient and insurance preference. Use up to four times daily as directed. (FOR ICD-10 E11.9).    Dispense:  1 each    Refill:  0    Order Specific Question:   Number of strips    Answer:   1000    Order Specific Question:   Number of lancets    Answer:   1000  . metFORMIN (GLUCOPHAGE) 500 MG tablet    Sig: Take 1 tablet (500 mg total) by mouth daily with breakfast.    Dispense:  30 tablet    Refill:  3  . aspirin EC 81 MG tablet    Sig: Take 1 tablet (81 mg total) by mouth daily.    I personally performed the services described in this documentation, which was scribed in my presence. The recorded information has been reviewed and considered, and addended by me as needed.   Delman Cheadle, M.D.  Primary Care at [facility] [address] [place], [place] [address] [phone] phone [phone] fax  12/08/16 12:15 AM

## 2016-11-20 LAB — COMPREHENSIVE METABOLIC PANEL
ALK PHOS: 70 IU/L (ref 39–117)
ALT: 43 IU/L (ref 0–44)
AST: 28 IU/L (ref 0–40)
Albumin/Globulin Ratio: 1.4 (ref 1.2–2.2)
Albumin: 4.2 g/dL (ref 3.5–5.5)
BUN/Creatinine Ratio: 13 (ref 9–20)
BUN: 13 mg/dL (ref 6–24)
Bilirubin Total: 0.6 mg/dL (ref 0.0–1.2)
CO2: 22 mmol/L (ref 18–29)
CREATININE: 0.97 mg/dL (ref 0.76–1.27)
Calcium: 9.6 mg/dL (ref 8.7–10.2)
Chloride: 101 mmol/L (ref 96–106)
GFR calc Af Amer: 103 mL/min/{1.73_m2} (ref >59)
GFR calc non Af Amer: 89 mL/min/{1.73_m2} (ref >59)
GLOBULIN, TOTAL: 2.9 g/dL (ref 1.5–4.5)
GLUCOSE: 139 mg/dL — AB (ref 65–99)
Potassium: 4.7 mmol/L (ref 3.5–5.2)
SODIUM: 138 mmol/L (ref 134–144)
Total Protein: 7.1 g/dL (ref 6.0–8.5)

## 2016-11-20 LAB — LIPID PANEL
CHOLESTEROL TOTAL: 274 mg/dL — AB (ref 100–199)
Chol/HDL Ratio: 7 ratio units — ABNORMAL HIGH (ref 0.0–5.0)
HDL: 39 mg/dL — ABNORMAL LOW (ref >39)
LDL CALC: 211 mg/dL — AB (ref 0–99)
Triglycerides: 122 mg/dL (ref 0–149)
VLDL Cholesterol Cal: 24 mg/dL (ref 5–40)

## 2016-12-16 ENCOUNTER — Ambulatory Visit (AMBULATORY_SURGERY_CENTER): Payer: Self-pay | Admitting: *Deleted

## 2016-12-16 VITALS — Ht 73.0 in | Wt 249.0 lb

## 2016-12-16 DIAGNOSIS — Z1211 Encounter for screening for malignant neoplasm of colon: Secondary | ICD-10-CM

## 2016-12-16 MED ORDER — NA SULFATE-K SULFATE-MG SULF 17.5-3.13-1.6 GM/177ML PO SOLN: 1.0000 | Freq: Once | ORAL | 0 refills | Status: AC

## 2016-12-16 NOTE — Progress Notes (Signed)
No egg or soy allergy known to patient  No issues with past sedation with any surgeries  or procedures, no intubation problems  No diet pills per patient No home 02 use per patient  No blood thinners per patient  Pt denies issues with constipation  No A fib or A flutter  emmi video to e mail    

## 2016-12-30 ENCOUNTER — Ambulatory Visit (AMBULATORY_SURGERY_CENTER): Payer: BLUE CROSS/BLUE SHIELD | Admitting: Gastroenterology

## 2016-12-30 ENCOUNTER — Encounter: Payer: Self-pay | Admitting: Gastroenterology

## 2016-12-30 VITALS — BP 114/78 | HR 85 | Temp 97.3°F | Resp 14 | Ht 73.0 in | Wt 249.0 lb

## 2016-12-30 DIAGNOSIS — Z1212 Encounter for screening for malignant neoplasm of rectum: Secondary | ICD-10-CM

## 2016-12-30 DIAGNOSIS — Z1211 Encounter for screening for malignant neoplasm of colon: Secondary | ICD-10-CM

## 2016-12-30 DIAGNOSIS — D122 Benign neoplasm of ascending colon: Secondary | ICD-10-CM

## 2016-12-30 MED ORDER — SODIUM CHLORIDE 0.9 % IV SOLN: 500.0000 mL | INTRAVENOUS | Status: DC

## 2016-12-30 NOTE — Op Note (Signed)
[place] Patient Name: Cameron James Procedure Date: 12/30/2016 10:17 AM MRN: [id number] Endoscopist: Mallie Mussel L. Loletha Carrow , MD Age: 56 Referring MD:  Date of Birth: Jun 27, 1961 Gender: Male Account #: [id number] Procedure:                Colonoscopy Indications:              Screening for colorectal malignant neoplasm, This                            is the patient's first colonoscopy Medicines:                Monitored Anesthesia Care Procedure:                Pre-Anesthesia Assessment:                           - Prior to the procedure, a History and Physical                            was performed, and patient medications and                            allergies were reviewed. The patient's tolerance of                            previous anesthesia was also reviewed. The risks                            and benefits of the procedure and the sedation                            options and risks were discussed with the patient.                            All questions were answered, and informed consent                            was obtained. Anticoagulants: The patient has taken                            aspirin. It was decided not to withhold this                            medication prior to the procedure. ASA Grade                            Assessment: II - A patient with mild systemic                            disease. After reviewing the risks and benefits,                            the patient was deemed in satisfactory condition to  undergo the procedure.                           After obtaining informed consent, the colonoscope                            was passed under direct vision. Throughout the                            procedure, the patient's blood pressure, pulse, and                            oxygen saturations were monitored continuously. The                            Colonoscope was introduced through the anus and                         advanced to the the cecum, identified by                            appendiceal orifice and ileocecal valve. The                            colonoscopy was performed without difficulty. The                            patient tolerated the procedure well. The quality                            of the bowel preparation was excellent. The                            ileocecal valve, appendiceal orifice, and rectum                            were photographed. Scope In: 10:29:47 AM Scope Out: 10:41:41 AM Scope Withdrawal Time: 0 hours 9 minutes 44 seconds  Total Procedure Duration: 0 hours 11 minutes 54 seconds  Findings:                 The perianal and digital rectal examinations were                            normal.                           A 2 mm polyp was found in the distal ascending                            colon. The polyp was sessile. The polyp was removed                            with a piecemeal technique using a cold biopsy  forceps. Resection and retrieval were complete.                           Multiple medium-mouthed diverticula were found in                            the left colon and right colon.                           The exam was otherwise without abnormality on                            direct and retroflexion views. Complications:            No immediate complications. Estimated Blood Loss:     Estimated blood loss: none. Impression:               - One 2 mm polyp in the distal ascending colon,                            removed piecemeal using a cold biopsy forceps.                            Resected and retrieved.                           - Diverticulosis in the left colon and in the right                            colon.                           - The examination was otherwise normal on direct                            and retroflexion views. Recommendation:           - Patient has a contact number  available for                            emergencies. The signs and symptoms of potential                            delayed complications were discussed with the                            patient. Return to normal activities tomorrow.                            Written discharge instructions were provided to the                            patient.                           - Resume previous diet.                           -  Continue present medications.                           - Await pathology results.                           - Repeat colonoscopy is recommended for                            surveillance. The colonoscopy date will be                            determined after pathology results from today's                            exam become available for review. Henry L. Loletha Carrow, MD 12/30/2016 10:45:20 AM This report has been signed electronically.

## 2016-12-30 NOTE — Patient Instructions (Signed)
YOU HAD AN ENDOSCOPIC PROCEDURE TODAY AT THE [facility]:   Refer to the procedure report that was given to you for any specific questions about what was found during the examination.  If the procedure report does not answer your questions, please call your gastroenterologist to clarify.  If you requested that your care partner not be given the details of your procedure findings, then the procedure report has been included in a sealed envelope for you to review at your convenience later.  YOU SHOULD EXPECT: Some feelings of bloating in the abdomen. Passage of more gas than usual.  Walking can help get rid of the air that was put into your GI tract during the procedure and reduce the bloating. If you had a lower endoscopy (such as a colonoscopy or flexible sigmoidoscopy) you may notice spotting of blood in your stool or on the toilet paper. If you underwent a bowel prep for your procedure, you may not have a normal bowel movement for a few days.  Please Note:  You might notice some irritation and congestion in your nose or some drainage.  This is from the oxygen used during your procedure.  There is no need for concern and it should clear up in a day or so.  SYMPTOMS TO REPORT IMMEDIATELY:   Following lower endoscopy (colonoscopy or flexible sigmoidoscopy):  Excessive amounts of blood in the stool  Significant tenderness or worsening of abdominal pains  Swelling of the abdomen that is new, acute  Fever of 100F or higher    For urgent or emergent issues, a gastroenterologist can be reached at any hour by calling [phone].   DIET:  We do recommend a small meal at first, but then you may proceed to your regular diet.  Drink plenty of fluids but you should avoid alcoholic beverages for 24 hours.  ACTIVITY:  You should plan to take it easy for the rest of today and you should NOT DRIVE or use heavy machinery until tomorrow (because of the sedation medicines used during the test).     FOLLOW UP: Our staff will call the number listed on your records the next business day following your procedure to check on you and address any questions or concerns that you may have regarding the information given to you following your procedure. If we do not reach you, we will leave a message.  However, if you are feeling well and you are not experiencing any problems, there is no need to return our call.  We will assume that you have returned to your regular daily activities without incident.  If any biopsies were taken you will be contacted by phone or by letter within the next 1-3 weeks.  Please call us at [phone] if you have not heard about the biopsies in 3 weeks.    SIGNATURES/CONFIDENTIALITY: You and/or your care partner have signed paperwork which will be entered into your electronic medical record.  These signatures attest to the fact that that the information above on your After Visit Summary has been reviewed and is understood.  Full responsibility of the confidentiality of this discharge information lies with you and/or your care-partner.   Resume medications. Information given on polyps,diverticulosis and high fiber diet. 

## 2016-12-30 NOTE — Progress Notes (Signed)
Called to room to assist during endoscopic procedure.  Patient ID and intended procedure confirmed with present staff. Received instructions for my participation in the procedure from the performing physician.  

## 2016-12-30 NOTE — Progress Notes (Signed)
Report given to PACU, vss 

## 2016-12-31 ENCOUNTER — Telehealth: Payer: Self-pay

## 2016-12-31 NOTE — Telephone Encounter (Signed)
  Follow up Call-  Call back number 12/30/2016  Post procedure Call Back phone  # [phone]  Permission to leave phone message Yes     Patient questions:  Do you have a fever, pain , or abdominal swelling? No. Pain Score  0 *  Have you tolerated food without any problems? Yes.    Have you been able to return to your normal activities? Yes.    Do you have any questions about your discharge instructions: Diet   No. Medications  No. Follow up visit  No.  Do you have questions or concerns about your Care? No.  Actions: * If pain score is 4 or above: No action needed, pain <4.   No problems noted per pt. maw

## 2017-01-02 ENCOUNTER — Encounter: Payer: Self-pay | Admitting: Gastroenterology

## 2017-01-04 NOTE — Telephone Encounter (Signed)
Sent letter asking his to come back in

## 2017-01-13 ENCOUNTER — Telehealth: Payer: Self-pay | Admitting: Family Medicine

## 2017-01-13 MED ORDER — BLOOD GLUCOSE MONITOR KIT: PACK | 0 refills | Status: DC

## 2017-01-13 NOTE — Telephone Encounter (Signed)
PT NEEDING [place] TO SEND RX GLUCOSE METER AND SUPPLIES TO THE [facility] [place] HE WAS HERE IN FEB DIDN'T GET THE KIT AT PHARMACY

## 2017-01-13 NOTE — Telephone Encounter (Signed)
faxed

## 2017-01-19 NOTE — Progress Notes (Signed)
   Subjective:    Patient ID: Cameron James, male    DOB: [birth year], 55 y.o.   MRN: [id number]  HPI  Cameron James is a 56 yo male who is here today for a follow-up newly diagnosed DM which was made 2 mos prior and pt was started on metformin at that time.  DMII: hgba1c 7.2 2 mos prior and started on metformin 500mg  qam. He had declined diabetic education that time time. Taking asa 81 qd? Checking cbgs - just got glucometer last wk? Diet change? Exercise- none. Optho- none but no blurry visit. Feet? Is fasting this morning.   HLD: Started pravastatin 40 2 mos ago  Works as a Geneticist, molecular out of state.  Review of Systems     Objective:   Physical Exam       BP 136/82 (BP Location: Right Arm, Patient Position: Sitting, Cuff Size: Large)   Pulse 77   Temp 99 F (37.2 C) (Oral)   Resp 16   Ht 6' 0.5" (1.842 m)   Wt 244 lb 12.8 oz (111 kg)   SpO2 98%   BMI 32.74 kg/m   Assessment & Plan:  cmp, urine microalb Foot exam  Refer to optho Pneumovax-23 1. Type 2 diabetes mellitus without complication, without long-term current use of insulin ([place])   2. Hyperlipidemia LDL goal <100   3. Medication monitoring encounter     Orders Placed This Encounter  Procedures  . Pneumococcal polysaccharide vaccine 23-valent greater than or equal to 2yo subcutaneous/IM  . Comprehensive metabolic panel  . Microalbumin/Creatinine Ratio, Urine  . Ambulatory referral to Ophthalmology    Referral Priority:   Routine    Referral Type:   Consultation    Referral Reason:   Specialty Services Required    Requested Specialty:   Ophthalmology    Number of Visits Requested:   1  . POCT glycosylated hemoglobin (Hb A1C)  . POCT glucose (manual entry)  . HM DIABETES FOOT EXAM    Meds ordered this encounter  Medications  . metFORMIN (GLUCOPHAGE) 500 MG tablet    Sig: Take 1 tablet (500 mg total) by mouth 2 (two) times daily with a meal.    Dispense:  180 tablet    Refill:  1     Delman Cheadle,  M.D.  Primary Care at [facility] [address] [place], [place] [address] [phone] phone [phone] fax  01/22/17 7:45 AM

## 2017-01-20 ENCOUNTER — Ambulatory Visit (INDEPENDENT_AMBULATORY_CARE_PROVIDER_SITE_OTHER): Payer: BLUE CROSS/BLUE SHIELD | Admitting: Family Medicine

## 2017-01-20 ENCOUNTER — Encounter: Payer: Self-pay | Admitting: Family Medicine

## 2017-01-20 VITALS — BP 136/82 | HR 77 | Temp 99.0°F | Resp 16 | Ht 72.5 in | Wt 244.8 lb

## 2017-01-20 DIAGNOSIS — E785 Hyperlipidemia, unspecified: Secondary | ICD-10-CM | POA: Diagnosis not present

## 2017-01-20 DIAGNOSIS — Z5181 Encounter for therapeutic drug level monitoring: Secondary | ICD-10-CM | POA: Diagnosis not present

## 2017-01-20 DIAGNOSIS — E119 Type 2 diabetes mellitus without complications: Secondary | ICD-10-CM

## 2017-01-20 DIAGNOSIS — Z23 Encounter for immunization: Secondary | ICD-10-CM

## 2017-01-20 LAB — POCT GLYCOSYLATED HEMOGLOBIN (HGB A1C): Hemoglobin A1C: 7.1

## 2017-01-20 LAB — GLUCOSE, POCT (MANUAL RESULT ENTRY): POC Glucose: 138 mg/dl — AB (ref 70–99)

## 2017-01-20 MED ORDER — METFORMIN HCL 500 MG PO TABS: 500.0000 mg | ORAL_TABLET | Freq: Two times a day (BID) | ORAL | 1 refills | Status: DC

## 2017-01-20 NOTE — Patient Instructions (Addendum)
IF you received an x-ray today, you will receive an invoice from [facility]. Please contact [facility] at [phone] with questions or concerns regarding your invoice.   IF you received labwork today, you will receive an invoice from [facility]. Please contact [facility] at [phone] with questions or concerns regarding your invoice.   Our billing staff will not be able to assist you with questions regarding bills from these companies.  You will be contacted with the lab results as soon as they are available. The fastest way to get your results is to activate your My Chart account. Instructions are located on the last page of this paperwork. If you have not heard from [place] regarding the results in 2 weeks, please contact this office.     Blood Glucose Monitoring, Adult Monitoring your blood sugar (glucose) helps you manage your diabetes. It also helps you and your health care provider determine how well your diabetes management plan is working. Blood glucose monitoring involves checking your blood glucose as often as directed, and keeping a record (log) of your results over time. Why should I monitor my blood glucose? Checking your blood glucose regularly can:  Help you understand how food, exercise, illnesses, and medicines affect your blood glucose.  Let you know what your blood glucose is at any time. You can quickly tell if you are having low blood glucose (hypoglycemia) or high blood glucose (hyperglycemia).  Help you and your health care provider adjust your medicines as needed. When should I check my blood glucose? Follow instructions from your health care provider about how often to check your blood glucose. This may depend on:  The type of diabetes you have.  How well-controlled your diabetes is.  Medicines you are taking. If you have type 1 diabetes:   Check your blood glucose at least 2 times a day.  Also check your blood glucose:  Before every  insulin injection.  Before and after exercise.  Between meals.  2 hours after a meal.  Occasionally between 2:00 a.m. and 3:00 a.m., as directed.  Before potentially dangerous tasks, like driving or using heavy machinery.  At bedtime.  You may need to check your blood glucose more often, up to 6-10 times a day:  If you use an insulin pump.  If you need multiple daily injections (MDI).  If your diabetes is not well-controlled.  If you are ill.  If you have a history of severe hypoglycemia.  If you have a history of not knowing when your blood glucose is getting low (hypoglycemia unawareness). If you have type 2 diabetes:   If you take insulin or other diabetes medicines, check your blood glucose at least 2 times a day.  If you are on intensive insulin therapy, check your blood glucose at least 4 times a day. Occasionally, you may also need to check between 2:00 a.m. and 3:00 a.m., as directed.  Also check your blood glucose:  Before and after exercise.  Before potentially dangerous tasks, like driving or using heavy machinery.  You may need to check your blood glucose more often if:  Your medicine is being adjusted.  Your diabetes is not well-controlled.  You are ill. What is a blood glucose log?  A blood glucose log is a record of your blood glucose readings. It helps you and your health care provider:  Look for patterns in your blood glucose over time.  Adjust your diabetes management plan as needed.  Every time you check  your blood glucose, write down your result and notes about things that may be affecting your blood glucose, such as your diet and exercise for the day.  Most glucose meters store a record of glucose readings in the meter. Some meters allow you to download your records to a computer. How do I check my blood glucose? Follow these steps to get accurate readings of your blood glucose: Supplies needed    Blood glucose meter.  Test strips  for your meter. Each meter has its own strips. You must use the strips that come with your meter.  A needle to prick your finger (lancet). Do not use lancets more than once.  A device that holds the lancet (lancing device).  A journal or log book to write down your results. Procedure   Wash your hands with soap and water.  Prick the side of your finger (not the tip) with the lancet. Use a different finger each time.  Gently rub the finger until a small drop of blood appears.  Follow instructions that come with your meter for inserting the test strip, applying blood to the strip, and using your blood glucose meter.  Write down your result and any notes. Alternative testing sites   Some meters allow you to use areas of your body other than your finger (alternative sites) to test your blood.  If you think you may have hypoglycemia, or if you have hypoglycemia unawareness, do not use alternative sites. Use your finger instead.  Alternative sites may not be as accurate as the fingers, because blood flow is slower in these areas. This means that the result you get may be delayed, and it may be different from the result that you would get from your finger.  The most common alternative sites are:  Forearm.  Thigh.  Palm of the hand. Additional tips   Always keep your supplies with you.  If you have questions or need help, all blood glucose meters have a 24-hour "hotline" number that you can call. You may also contact your health care provider.  After you use a few boxes of test strips, adjust (calibrate) your blood glucose meter by following instructions that came with your meter. This information is not intended to replace advice given to you by your health care provider. Make sure you discuss any questions you have with your health care provider. Document Released: 10/03/2003 Document Revised: 04/19/2016 Document Reviewed: 03/11/2016 Elsevier Interactive Patient Education  2017  [employer].  Diabetes Mellitus and Food It is important for you to manage your blood sugar (glucose) level. Your blood glucose level can be greatly affected by what you eat. Eating healthier foods in the appropriate amounts throughout the day at about the same time each day will help you control your blood glucose level. It can also help slow or prevent worsening of your diabetes mellitus. Healthy eating may even help you improve the level of your blood pressure and reach or maintain a healthy weight. General recommendations for healthful eating and cooking habits include:  Eating meals and snacks regularly. Avoid going long periods of time without eating to lose weight.  Eating a diet that consists mainly of plant-based foods, such as fruits, vegetables, nuts, legumes, and whole grains.  Using low-heat cooking methods, such as baking, instead of high-heat cooking methods, such as deep frying. Work with your dietitian to make sure you understand how to use the Nutrition Facts information on food labels. How can food affect me? Carbohydrates  Carbohydrates affect your blood glucose level more than any other type of food. Your dietitian will help you determine how many carbohydrates to eat at each meal and teach you how to count carbohydrates. Counting carbohydrates is important to keep your blood glucose at a healthy level, especially if you are using insulin or taking certain medicines for diabetes mellitus. Alcohol  Alcohol can cause sudden decreases in blood glucose (hypoglycemia), especially if you use insulin or take certain medicines for diabetes mellitus. Hypoglycemia can be a life-threatening condition. Symptoms of hypoglycemia (sleepiness, dizziness, and disorientation) are similar to symptoms of having too much alcohol. If your health care provider has given you approval to drink alcohol, do so in moderation and use the following guidelines:  Women should not have more than one drink  per day, and men should not have more than two drinks per day. One drink is equal to:  12 oz of beer.  5 oz of wine.  1 oz of hard liquor.  Do not drink on an empty stomach.  Keep yourself hydrated. Have water, diet soda, or unsweetened iced tea.  Regular soda, juice, and other mixers might contain a lot of carbohydrates and should be counted. What foods are not recommended? As you make food choices, it is important to remember that all foods are not the same. Some foods have fewer nutrients per serving than other foods, even though they might have the same number of calories or carbohydrates. It is difficult to get your body what it needs when you eat foods with fewer nutrients. Examples of foods that you should avoid that are high in calories and carbohydrates but low in nutrients include:  Trans fats (most processed foods list trans fats on the Nutrition Facts label).  Regular soda.  Juice.  Candy.  Sweets, such as cake, pie, doughnuts, and cookies.  Fried foods. What foods can I eat? Eat nutrient-rich foods, which will nourish your body and keep you healthy. The food you should eat also will depend on several factors, including:  The calories you need.  The medicines you take.  Your weight.  Your blood glucose level.  Your blood pressure level.  Your cholesterol level. You should eat a variety of foods, including:  Protein.  Lean cuts of meat.  Proteins low in saturated fats, such as fish, egg whites, and beans. Avoid processed meats.  Fruits and vegetables.  Fruits and vegetables that may help control blood glucose levels, such as apples, mangoes, and yams.  Dairy products.  Choose fat-free or low-fat dairy products, such as milk, yogurt, and cheese.  Grains, bread, pasta, and rice.  Choose whole grain products, such as multigrain bread, whole oats, and brown rice. These foods may help control blood pressure.  Fats.  Foods containing healthful  fats, such as nuts, avocado, olive oil, canola oil, and fish. Does everyone with diabetes mellitus have the same meal plan? Because every person with diabetes mellitus is different, there is not one meal plan that works for everyone. It is very important that you meet with a dietitian who will help you create a meal plan that is just right for you. This information is not intended to replace advice given to you by your health care provider. Make sure you discuss any questions you have with your health care provider. Document Released: 06/27/2005 Document Revised: 03/07/2016 Document Reviewed: 08/27/2013 Elsevier Interactive Patient Education  2017 [employer].

## 2017-01-21 LAB — COMPREHENSIVE METABOLIC PANEL
ALBUMIN: 4.4 g/dL (ref 3.5–5.5)
ALT: 36 IU/L (ref 0–44)
AST: 24 IU/L (ref 0–40)
Albumin/Globulin Ratio: 1.5 (ref 1.2–2.2)
Alkaline Phosphatase: 74 IU/L (ref 39–117)
BUN / CREAT RATIO: 12 (ref 9–20)
BUN: 12 mg/dL (ref 6–24)
Bilirubin Total: 0.2 mg/dL (ref 0.0–1.2)
CALCIUM: 9.5 mg/dL (ref 8.7–10.2)
CO2: 23 mmol/L (ref 18–29)
CREATININE: 0.99 mg/dL (ref 0.76–1.27)
Chloride: 100 mmol/L (ref 96–106)
GFR calc Af Amer: 100 mL/min/{1.73_m2} (ref >59)
GFR, EST NON AFRICAN AMERICAN: 87 mL/min/{1.73_m2} (ref >59)
GLOBULIN, TOTAL: 2.9 g/dL (ref 1.5–4.5)
Glucose: 148 mg/dL — ABNORMAL HIGH (ref 65–99)
Potassium: 4.1 mmol/L (ref 3.5–5.2)
SODIUM: 140 mmol/L (ref 134–144)
Total Protein: 7.3 g/dL (ref 6.0–8.5)

## 2017-01-21 LAB — MICROALBUMIN / CREATININE URINE RATIO
Creatinine, Urine: 167.2 mg/dL
Microalb/Creat Ratio: 2.9 mg/g creat (ref 0.0–30.0)
Microalbumin, Urine: 4.8 ug/mL

## 2017-02-27 ENCOUNTER — Other Ambulatory Visit: Payer: Self-pay | Admitting: Family Medicine

## 2017-03-03 ENCOUNTER — Other Ambulatory Visit: Payer: Self-pay | Admitting: Family Medicine

## 2017-03-03 NOTE — Telephone Encounter (Signed)
PATIENT WOULD LIKE DR. SHAW TO KNOW THAT HIS GLUCOSE MONITOR HAS GONE BAD. THE MANUFACTURER HAS SENT HIM A VOUCHER TO GET ANOTHER ONE. HOWEVER, HIS PHARMACY TOLD HIM THAT DR. SHAW NEEDS TO WRITE A NEW PRESCRIPTION. HE USES 1 TOUCH ULTRA 2. 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 (I DID EXPLAIN OUR POLICY). BEST PHONE [phone] (CELL) PHARMACY CHOICE IS [facility] ON [address] IN [place]. [place]

## 2017-03-04 MED ORDER — BLOOD GLUCOSE MONITOR KIT: PACK | 0 refills | Status: DC

## 2017-03-05 NOTE — Telephone Encounter (Signed)
Faxed to pharmacy selected

## 2017-03-07 ENCOUNTER — Encounter: Payer: Self-pay | Admitting: Family Medicine

## 2017-03-07 ENCOUNTER — Other Ambulatory Visit: Payer: Self-pay | Admitting: Family Medicine

## 2017-03-07 MED ORDER — PRAVASTATIN SODIUM 40 MG PO TABS: 40.0000 mg | ORAL_TABLET | Freq: Every day | ORAL | 2 refills | Status: DC

## 2017-03-07 NOTE — Progress Notes (Signed)
Received faxed refill request

## 2017-05-10 ENCOUNTER — Other Ambulatory Visit: Payer: Self-pay | Admitting: Family Medicine

## 2017-05-10 NOTE — Telephone Encounter (Signed)
Has appt w/ me sched 8/9 - 2 wks

## 2017-05-21 DIAGNOSIS — E6609 Other obesity due to excess calories: Secondary | ICD-10-CM | POA: Insufficient documentation

## 2017-05-21 DIAGNOSIS — Z6832 Body mass index (BMI) 32.0-32.9, adult: Secondary | ICD-10-CM

## 2017-05-21 DIAGNOSIS — E785 Hyperlipidemia, unspecified: Secondary | ICD-10-CM | POA: Insufficient documentation

## 2017-05-21 DIAGNOSIS — E119 Type 2 diabetes mellitus without complications: Secondary | ICD-10-CM | POA: Insufficient documentation

## 2017-05-21 NOTE — Progress Notes (Deleted)
   Subjective:    Patient ID: Cameron James, male    DOB: [birth year], 56 y.o.   MRN: [id number]  HPI  Cameron James is a delightful 56 yo male who is here today to f/u on DMII which was diagnosed 11/2016 (6 mos prior).  DMII: Started on metformin 500 qam at time of initial diagnosis 6 mos prior then increased to bid at visit 4 mos ago when there had been minimal a1c improvement.  Has refused DM ed.  Taking asa 81 qd?  Checking cbgs? Started exercise? Diet?  Optho- referred to Dr. Herbert Deaner 4 mos prior but pt did not call back to sched? Feet AND microalb nml 01/20/17.  tdap and pneumovax UTD. Lab Results  Component Value Date   HGBA1C 7.1 01/20/2017   HGBA1C 7.2 11/19/2016   HLD: Started pravastatin 40 6 mos prior (11/2016).   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  Review of Systems     Objective:   Physical Exam        Assessment & Plan:  A1c, lipid, cmp Optho???

## 2017-05-22 ENCOUNTER — Encounter: Payer: BLUE CROSS/BLUE SHIELD | Admitting: Family Medicine

## 2017-05-27 ENCOUNTER — Telehealth: Payer: Self-pay | Admitting: Family Medicine

## 2017-05-27 NOTE — Progress Notes (Signed)
This encounter was created in error - please disregard.

## 2017-05-27 NOTE — Telephone Encounter (Signed)
Pt had a f/u OV sched w/ me on  which he cancelled/no-showed. Below were the notes I had made prior to his appt in order to expedite his visit.   Mr. Ganus is a delightful 56 yo male who is here today to f/u on DMII which was diagnosed 11/2016 (6 mos prior).  DMII: Started on metformin 500 qam at time of initial diagnosis 6 mos prior then increased to bid at visit 4 mos ago when there had been minimal a1c improvement.  Has refused DM ed.  Taking asa 81 qd?  Checking cbgs? Started exercise? Diet?  Optho- referred to Dr. Herbert Deaner 4 mos prior but pt did not call back to sched? Feet AND microalb nml 01/20/17.  tdap and pneumovax UTD. Lab Results  Component Value Date   HGBA1C 7.1 01/20/2017   HGBA1C 7.2 11/19/2016   HLD: Started pravastatin 40 6 mos prior (11/2016).   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    Assessment/Plan: A1c, lipid, cmp Optho???

## 2017-09-15 ENCOUNTER — Other Ambulatory Visit: Payer: Self-pay | Admitting: Family Medicine

## 2018-01-17 ENCOUNTER — Other Ambulatory Visit: Payer: Self-pay | Admitting: Family Medicine

## 2018-02-18 ENCOUNTER — Other Ambulatory Visit: Payer: Self-pay | Admitting: Family Medicine

## 2018-02-19 NOTE — Telephone Encounter (Signed)
LOV  01/20/17 Dr. Brigitte Pulse Last refill  05/10/17  #60  0 refill

## 2018-03-21 ENCOUNTER — Other Ambulatory Visit: Payer: Self-pay | Admitting: Family Medicine

## 2018-03-23 NOTE — Telephone Encounter (Signed)
Left message on voicemail for pt to call office to schedule appt for further refills of medication.

## 2018-03-24 ENCOUNTER — Encounter: Payer: Self-pay | Admitting: Family Medicine

## 2018-03-24 ENCOUNTER — Other Ambulatory Visit: Payer: Self-pay

## 2018-03-24 ENCOUNTER — Ambulatory Visit (INDEPENDENT_AMBULATORY_CARE_PROVIDER_SITE_OTHER): Payer: BLUE CROSS/BLUE SHIELD | Admitting: Family Medicine

## 2018-03-24 VITALS — BP 130/82 | HR 100 | Temp 98.2°F | Ht 72.0 in | Wt 245.2 lb

## 2018-03-24 DIAGNOSIS — Z6832 Body mass index (BMI) 32.0-32.9, adult: Secondary | ICD-10-CM

## 2018-03-24 DIAGNOSIS — E6609 Other obesity due to excess calories: Secondary | ICD-10-CM

## 2018-03-24 DIAGNOSIS — E1142 Type 2 diabetes mellitus with diabetic polyneuropathy: Secondary | ICD-10-CM | POA: Diagnosis not present

## 2018-03-24 DIAGNOSIS — E785 Hyperlipidemia, unspecified: Secondary | ICD-10-CM

## 2018-03-24 LAB — POCT GLYCOSYLATED HEMOGLOBIN (HGB A1C): Hemoglobin A1C: 8.2 % — AB (ref 4.0–5.6)

## 2018-03-24 LAB — POCT URINALYSIS DIP (MANUAL ENTRY)
Bilirubin, UA: NEGATIVE
Glucose, UA: 500 mg/dL — AB
Leukocytes, UA: NEGATIVE
NITRITE UA: NEGATIVE
PH UA: 5.5 (ref 5.0–8.0)
Protein Ur, POC: NEGATIVE mg/dL
RBC UA: NEGATIVE
SPEC GRAV UA: 1.025 (ref 1.010–1.025)
Urobilinogen, UA: 1 E.U./dL

## 2018-03-24 LAB — GLUCOSE, POCT (MANUAL RESULT ENTRY): POC Glucose: 212 mg/dL — AB (ref 70–99)

## 2018-03-24 MED ORDER — FREESTYLE LIBRE 14 DAY READER DEVI: 1.0000 [IU] | Freq: Every day | 0 refills | Status: DC

## 2018-03-24 MED ORDER — FREESTYLE LIBRE 14 DAY SENSOR MISC: 1.0000 [IU] | Freq: Every day | 11 refills | Status: DC

## 2018-03-24 MED ORDER — DAPAGLIFLOZIN PRO-METFORMIN ER 5-1000 MG PO TB24: 1.0000 | ORAL_TABLET | Freq: Every day | ORAL | 5 refills | Status: DC

## 2018-03-24 NOTE — Progress Notes (Addendum)
Subjective:   03/24/2018 at 12:59 PM.   Patient ID: Ricardo Jericho, male    DOB: 1961-01-09, 57 y.o.   MRN: [id number]  Chief Complaint  Patient presents with  . Medication Refill    Metformin  . Medication Management    Pt wanted to know if he should be taking Aspirin, have not been taking   HPI Khoen Genet is a 57 y.o. male who presents to Primary Care at [facility] for med refill.  No hypoglycemic episodes.  Checking cbgs after breakfast and dinner and usually    Past Medical History:  Diagnosis Date  . Blood in stool    long ago- polyps removed per pt- > 15 yrs ago   . Diabetes mellitus without complication (HCC)    borderline- on metformin  . Hyperlipidemia    Current Outpatient Medications on File Prior to Visit  Medication Sig Dispense Refill  . blood glucose meter kit and supplies KIT One touch ultra. Use up to four times daily as directed. (FOR ICD-10 E11.9). 1 each 0  . DULoxetine (CYMBALTA) 60 MG capsule     . gabapentin (NEURONTIN) 300 MG capsule TAKE ONE CAPSULE BY MOUTH EVERY 8 HOURS AS NEEDED  5  . metFORMIN (GLUCOPHAGE) 500 MG tablet Take 1 tablet (500 mg total) by mouth 2 (two) times daily with a meal. 60 tablet 0  . Vitamin D, Ergocalciferol, (DRISDOL) 50000 units CAPS capsule     . aspirin EC 81 MG tablet Take 1 tablet (81 mg total) by mouth daily. (Patient not taking: Reported on 03/24/2018)     Current Facility-Administered Medications on File Prior to Visit  Medication Dose Route Frequency Provider Last Rate Last Dose  . 0.9 %  sodium chloride infusion  500 mL Intravenous Continuous Doran Stabler, MD        Past Surgical History:  Procedure Laterality Date  . POLYPECTOMY     in [place]- pt unsure when > 15 yrs ago    No Known Allergies Family History  Problem Relation Age of Onset  . Diabetes Mother   . Hypertension Mother   . Heart disease Father   . Colon cancer Neg Hx   . Colon polyps Neg Hx   . Esophageal cancer Neg Hx   . Rectal  cancer Neg Hx   . Stomach cancer Neg Hx    Social History   Socioeconomic History  . Marital status: Married    Spouse name: Not on file  . Number of children: Not on file  . Years of education: Not on file  . Highest education level: Not on file  Occupational History  . Not on file  Social Needs  . Financial resource strain: Not on file  . Food insecurity:    Worry: Not on file    Inability: Not on file  . Transportation needs:    Medical: Not on file    Non-medical: Not on file  Tobacco Use  . Smoking status: Never Smoker  . Smokeless tobacco: Never Used  Substance and Sexual Activity  . Alcohol use: Yes    Alcohol/week: 1.2 oz    Types: 1 Cans of beer, 1 Shots of liquor per week  . Drug use: No  . Sexual activity: Not on file  Lifestyle  . Physical activity:    Days per week: Not on file    Minutes per session: Not on file  . Stress: Not on file  Relationships  . Social connections:  Talks on phone: Not on file    Gets together: Not on file    Attends religious service: Not on file    Active member of club or organization: Not on file    Attends meetings of clubs or organizations: Not on file    Relationship status: Not on file  Other Topics Concern  . Not on file  Social History Narrative  . Not on file   Depression screen [facility] 2/9 03/24/2018 01/20/2017 11/19/2016 10/22/2016  Decreased Interest 0 0 0 0  Down, Depressed, Hopeless 0 0 0 0  PHQ - 2 Score 0 0 0 0     Review of Systems  Constitutional: Negative for activity change, appetite change, chills, diaphoresis, fatigue, fever and unexpected weight change.  Endocrine: Negative for polydipsia, polyphagia and polyuria.  Genitourinary: Negative for decreased urine volume, dysuria, frequency and urgency.  Allergic/Immunologic: Negative for immunocompromised state.  Neurological: Positive for numbness. Negative for dizziness, weakness and light-headedness.     see hpi Objective:   Physical Exam    Constitutional: He is oriented to person, place, and time. He appears well-developed and well-nourished. No distress.  HENT:  Head: Normocephalic and atraumatic.  Eyes: Conjunctivae and EOM are normal.  Neck: Neck supple. No tracheal deviation present.  Cardiovascular: Normal rate.  Pulmonary/Chest: Effort normal. No respiratory distress.  Musculoskeletal: Normal range of motion.  Neurological: He is alert and oriented to person, place, and time.  Skin: Skin is warm and dry.  Psychiatric: He has a normal mood and affect. His behavior is normal.  Nursing note and vitals reviewed.  BP 130/82 (BP Location: Right Arm, Patient Position: Sitting, Cuff Size: Normal)   Pulse 100   Temp 98.2 F (36.8 C) (Oral)   Ht 6' (1.829 m)   Wt 245 lb 3.2 oz (111.2 kg)   SpO2 95%   BMI 33.26 kg/m     Diabetic Foot Exam - Simple   Simple Foot Form Visual Inspection No deformities, no ulcerations, no other skin breakdown bilaterally:  Yes Sensation Testing Intact to touch and monofilament testing bilaterally:  Yes Pulse Check Posterior Tibialis and Dorsalis pulse intact bilaterally:  Yes Comments     Assessment & Plan:   1. Type 2 diabetes mellitus with diabetic polyneuropathy, without long-term current use of insulin ([place]) - yes, start asa 81 qd for primary prevention. Uncontrolled cbgs on metformin 500 bid - start xigdou xr instead to increase cbgs control and compliance - same dose total metformin 1045m qd and add in sglt2. Rec nutritionist and DM ed to help w/ tlc.  2. Diabetic polyneuropathy associated with type 2 diabetes mellitus (HCC)   3. Class 1 obesity due to excess calories with serious comorbidity and body mass index (BMI) of 32.0 to 32.9 in adult   4. Hyperlipidemia LDL goal <100 - stopped pravastatin 40- will likely need to restart statin    Orders Placed This Encounter  Procedures  . Comprehensive metabolic panel  . Microalbumin/Creatinine Ratio, Urine  . Lipid panel     Order Specific Question:   Has the patient fasted?    Answer:   Yes  . Ambulatory referral to Ophthalmology    Referral Priority:   Routine    Referral Type:   Consultation    Referral Reason:   Specialty Services Required    Requested Specialty:   Ophthalmology    Number of Visits Requested:   1  . Ambulatory referral to diabetic education    Referral Priority:  Routine    Referral Type:   Consultation    Referral Reason:   Specialty Services Required    Number of Visits Requested:   1  . POCT glycosylated hemoglobin (Hb A1C)  . POCT glucose (manual entry)  . POCT urinalysis dipstick  . HM DIABETES FOOT EXAM    Meds ordered this encounter  Medications  . Continuous Blood Gluc Receiver (FREESTYLE LIBRE 14 DAY READER) DEVI    Sig: 1 Units by Does not apply route 6 (six) times daily. Due to hyperglycemia, med changes, diet adjustments Dx: E11.42    Dispense:  1 Device    Refill:  0  . Continuous Blood Gluc Sensor (FREESTYLE LIBRE 14 DAY SENSOR) MISC    Sig: 1 Units by Does not apply route 6 (six) times daily. Due to hyperglycemia, med changes, diet adjustments Dx: E11.42    Dispense:  2 each    Refill:  11  . Dapagliflozin-metFORMIN HCl ER (XIGDUO XR) 02-999 MG TB24    Sig: Take 1 tablet by mouth daily.    Dispense:  30 tablet    Refill:  5   Results for orders placed or performed in visit on 03/24/18  Comprehensive metabolic panel  Result Value Ref Range   Glucose 219 (H) 65 - 99 mg/dL   BUN 16 6 - 24 mg/dL   Creatinine, Ser 0.98 0.76 - 1.27 mg/dL   GFR calc non Af Amer 86 >59 mL/min/1.73   GFR calc Af Amer 100 >59 mL/min/1.73   BUN/Creatinine Ratio 16 9 - 20   Sodium 142 134 - 144 mmol/L   Potassium 4.4 3.5 - 5.2 mmol/L   Chloride 106 96 - 106 mmol/L   CO2 21 20 - 29 mmol/L   Calcium 9.3 8.7 - 10.2 mg/dL   Total Protein 7.3 6.0 - 8.5 g/dL   Albumin 4.4 3.5 - 5.5 g/dL   Globulin, Total 2.9 1.5 - 4.5 g/dL   Albumin/Globulin Ratio 1.5 1.2 - 2.2   Bilirubin Total <0.2  0.0 - 1.2 mg/dL   Alkaline Phosphatase 75 39 - 117 IU/L   AST 26 0 - 40 IU/L   ALT 48 (H) 0 - 44 IU/L  Microalbumin/Creatinine Ratio, Urine  Result Value Ref Range   Creatinine, Urine 161.5 Not Estab. mg/dL   Microalbumin, Urine 10.2 Not Estab. ug/mL   Microalb/Creat Ratio 6.3 0.0 - 30.0 mg/g creat  Lipid panel  Result Value Ref Range   Cholesterol, Total 262 (H) 100 - 199 mg/dL   Triglycerides 178 (H) 0 - 149 mg/dL   HDL 38 (L) >39 mg/dL   VLDL Cholesterol Cal 36 5 - 40 mg/dL   LDL Calculated 188 (H) 0 - 99 mg/dL   Chol/HDL Ratio 6.9 (H) 0.0 - 5.0 ratio  POCT glycosylated hemoglobin (Hb A1C)  Result Value Ref Range   Hemoglobin A1C 8.2 (A) 4.0 - 5.6 %   HbA1c, POC (prediabetic range)  5.7 - 6.4 %   HbA1c, POC (controlled diabetic range)  0.0 - 7.0 %  POCT glucose (manual entry)  Result Value Ref Range   POC Glucose 212 (A) 70 - 99 mg/dl  POCT urinalysis dipstick  Result Value Ref Range   Color, UA yellow yellow   Clarity, UA clear clear   Glucose, UA =500 (A) negative mg/dL   Bilirubin, UA negative negative   Ketones, POC UA trace (5) (A) negative mg/dL   Spec Grav, UA 1.025 1.010 - 1.025   Blood, UA negative negative  pH, UA 5.5 5.0 - 8.0   Protein Ur, POC negative negative mg/dL   Urobilinogen, UA 1.0 0.2 or 1.0 E.U./dL   Nitrite, UA Negative Negative   Leukocytes, UA Negative Negative     Delman Cheadle, M.D.  Primary Care at [facility] [address] [place], [place] [address] [phone] phone [phone] fax  03/24/18 1:41 PM

## 2018-03-24 NOTE — Patient Instructions (Addendum)
Start an 81mg  aspirin daily.  A local nutritionist who does a GREAT job is Animator at [employer]. She is very pro-food and helps you figure out how to enjoy the things you like most while cutting corners where you don't need so you don't have to diet but can live a healthy lifestyle for your body.  Check out her website: [email or web address]g.[email or web address]/ [facility] [place] [address], [place], [place] [address]  [place] (the two nutritionists at [employer]) are both [employer] credentialed providers, many [facility] clients can receive nutrition services from [employer] [facility] at no cost to the client.    IF you received an x-ray today, you will receive an invoice from [facility]. Please contact [facility] at [phone] with questions or concerns regarding your invoice.   IF you received labwork today, you will receive an invoice from [facility]. Please contact [facility] at [phone] with questions or concerns regarding your invoice.   Our billing staff will not be able to assist you with questions regarding bills from these companies.  You will be contacted with the lab results as soon as they are available. The fastest way to get your results is to activate your My Chart account. Instructions are located on the last page of this paperwork. If you have not heard from [place] regarding the results in 2 weeks, please contact this office.      Tips for Eating Away From Home If You Have Diabetes Controlling your level of blood glucose, also known as blood sugar, can be challenging. It can be even more difficult when you do not prepare your own meals. The following tips can help you manage your diabetes when you eat away from home. Planning ahead Plan ahead if you know you will be eating away from home:  Ask your health care provider how to time meals and medicine if you are taking  insulin.  Make a list of restaurants near you that offer healthy choices. If they have a carry-out menu, take it home and plan what you will order ahead of time.  Look up the restaurant you want to eat at online. Many chain and fast-food restaurants list nutritional information online. Use this information to choose the healthiest options and to calculate how many carbohydrates will be in your meal.  Use a carbohydrate-counting book or mobile app to look up the carbohydrate content and serving size of the foods you want to eat.  Become familiar with serving sizes and learn to recognize how many servings are in a portion. This will allow you to estimate how many carbohydrates you can eat.  Free foods A "free food" is any food or drink that has less than 5 g of carbohydrates per serving. Free foods include:  Many vegetables.  Hard boiled eggs.  Nuts or seeds.  Olives.  Cheeses.  Meats.  These types of foods make good appetizer choices and are often available at salad bars. Lemon juice, vinegar, or a low-calorie salad dressing of fewer than 20 calories per serving can be used as a "free" salad dressing. Choices to reduce carbohydrates  Substitute nonfat sweetened yogurt with a sugar-free yogurt. Yogurt made from soy milk may also be used, but you will still want a sugar-free or plain option to choose a lower carbohydrate amount.  Ask your server to take away the bread basket or chips from your table.  Order fresh fruit. A salad bar often  offers fresh fruit choices. Avoid canned fruit because it is usually packed in sugar or syrup.  Order a salad, and eat it without dressing. Or, create a "free" salad dressing.  Ask for substitutions. For example, instead of [place] fries, request an order of a vegetable such as salad, green beans, or broccoli. Other tips  If you take insulin, take the insulin once your food arrives to your table. This will ensure your insulin and food are timed  correctly.  Ask your server about the portion size before your order, and ask for a take-out box if the portion has more servings than you should have. When your food comes, leave the amount you should have on the plate, and put the rest in the take-out box.  Consider splitting an entree with someone and ordering a side salad. This information is not intended to replace advice given to you by your health care provider. Make sure you discuss any questions you have with your health care provider. Document Released: 09/30/2005 Document Revised: 03/07/2016 Document Reviewed: 12/28/2013 Elsevier Interactive Patient Education  2018 [place].  Blood Glucose Monitoring, Adult Monitoring your blood sugar (glucose) helps you manage your diabetes. It also helps you and your health care provider determine how well your diabetes management plan is working. Blood glucose monitoring involves checking your blood glucose as often as directed, and keeping a record (log) of your results over time. Why should I monitor my blood glucose? Checking your blood glucose regularly can:  Help you understand how food, exercise, illnesses, and medicines affect your blood glucose.  Let you know what your blood glucose is at any time. You can quickly tell if you are having low blood glucose (hypoglycemia) or high blood glucose (hyperglycemia).  Help you and your health care provider adjust your medicines as needed.  When should I check my blood glucose? Follow instructions from your health care provider about how often to check your blood glucose. This may depend on:  The type of diabetes you have.  How well-controlled your diabetes is.  Medicines you are taking.  If you have type 1 diabetes:  Check your blood glucose at least 2 times a day.  Also check your blood glucose: ? Before every insulin injection. ? Before and after exercise. ? Between meals. ? 2 hours after a meal. ? Occasionally between 2:00  a.m. and 3:00 a.m., as directed. ? Before potentially dangerous tasks, like driving or using heavy machinery. ? At bedtime.  You may need to check your blood glucose more often, up to 6-10 times a day: ? If you use an insulin pump. ? If you need multiple daily injections (MDI). ? If your diabetes is not well-controlled. ? If you are ill. ? If you have a history of severe hypoglycemia. ? If you have a history of not knowing when your blood glucose is getting low (hypoglycemia unawareness). If you have type 2 diabetes:  If you take insulin or other diabetes medicines, check your blood glucose at least 2 times a day.  If you are on intensive insulin therapy, check your blood glucose at least 4 times a day. Occasionally, you may also need to check between 2:00 a.m. and 3:00 a.m., as directed.  Also check your blood glucose: ? Before and after exercise. ? Before potentially dangerous tasks, like driving or using heavy machinery.  You may need to check your blood glucose more often if: ? Your medicine is being adjusted. ? Your diabetes is not well-controlled. ?  You are ill. What is a blood glucose log?  A blood glucose log is a record of your blood glucose readings. It helps you and your health care provider: ? Look for patterns in your blood glucose over time. ? Adjust your diabetes management plan as needed.  Every time you check your blood glucose, write down your result and notes about things that may be affecting your blood glucose, such as your diet and exercise for the day.  Most glucose meters store a record of glucose readings in the meter. Some meters allow you to download your records to a computer. How do I check my blood glucose? Follow these steps to get accurate readings of your blood glucose: Supplies needed   Blood glucose meter.  Test strips for your meter. Each meter has its own strips. You must use the strips that come with your meter.  A needle to prick your  finger (lancet). Do not use lancets more than once.  A device that holds the lancet (lancing device).  A journal or log book to write down your results. Procedure  Wash your hands with soap and water.  Prick the side of your finger (not the tip) with the lancet. Use a different finger each time.  Gently rub the finger until a small drop of blood appears.  Follow instructions that come with your meter for inserting the test strip, applying blood to the strip, and using your blood glucose meter.  Write down your result and any notes. Alternative testing sites  Some meters allow you to use areas of your body other than your finger (alternative sites) to test your blood.  If you think you may have hypoglycemia, or if you have hypoglycemia unawareness, do not use alternative sites. Use your finger instead.  Alternative sites may not be as accurate as the fingers, because blood flow is slower in these areas. This means that the result you get may be delayed, and it may be different from the result that you would get from your finger.  The most common alternative sites are: ? Forearm. ? Thigh. ? Palm of the hand. Additional tips  Always keep your supplies with you.  If you have questions or need help, all blood glucose meters have a 24-hour "hotline" number that you can call. You may also contact your health care provider.  After you use a few boxes of test strips, adjust (calibrate) your blood glucose meter by following instructions that came with your meter. This information is not intended to replace advice given to you by your health care provider. Make sure you discuss any questions you have with your health care provider. Document Released: 10/03/2003 Document Revised: 04/19/2016 Document Reviewed: 03/11/2016 Elsevier Interactive Patient Education  2017 [employer].

## 2018-03-25 LAB — COMPREHENSIVE METABOLIC PANEL
ALK PHOS: 75 IU/L (ref 39–117)
ALT: 48 IU/L — ABNORMAL HIGH (ref 0–44)
AST: 26 IU/L (ref 0–40)
Albumin/Globulin Ratio: 1.5 (ref 1.2–2.2)
Albumin: 4.4 g/dL (ref 3.5–5.5)
BUN/Creatinine Ratio: 16 (ref 9–20)
BUN: 16 mg/dL (ref 6–24)
Bilirubin Total: <0.2 mg/dL (ref 0.0–1.2)
CHLORIDE: 106 mmol/L (ref 96–106)
CO2: 21 mmol/L (ref 20–29)
CREATININE: 0.98 mg/dL (ref 0.76–1.27)
Calcium: 9.3 mg/dL (ref 8.7–10.2)
GFR calc Af Amer: 100 mL/min/{1.73_m2} (ref >59)
GFR calc non Af Amer: 86 mL/min/{1.73_m2} (ref >59)
GLOBULIN, TOTAL: 2.9 g/dL (ref 1.5–4.5)
Glucose: 219 mg/dL — ABNORMAL HIGH (ref 65–99)
POTASSIUM: 4.4 mmol/L (ref 3.5–5.2)
Sodium: 142 mmol/L (ref 134–144)
Total Protein: 7.3 g/dL (ref 6.0–8.5)

## 2018-03-25 LAB — LIPID PANEL
CHOL/HDL RATIO: 6.9 ratio — AB (ref 0.0–5.0)
Cholesterol, Total: 262 mg/dL — ABNORMAL HIGH (ref 100–199)
HDL: 38 mg/dL — ABNORMAL LOW (ref >39)
LDL Calculated: 188 mg/dL — ABNORMAL HIGH (ref 0–99)
Triglycerides: 178 mg/dL — ABNORMAL HIGH (ref 0–149)
VLDL Cholesterol Cal: 36 mg/dL (ref 5–40)

## 2018-03-25 LAB — MICROALBUMIN / CREATININE URINE RATIO
CREATININE, UR: 161.5 mg/dL
MICROALBUM., U, RANDOM: 10.2 ug/mL
Microalb/Creat Ratio: 6.3 mg/g creat (ref 0.0–30.0)

## 2018-05-13 ENCOUNTER — Telehealth: Payer: Self-pay | Admitting: Family Medicine

## 2018-05-13 NOTE — Telephone Encounter (Signed)
Called patient about an appt he has on 07/27/2018. Provider will not be in the office on that day and will need to be rescheduled. Pt did not have an updated dpr on file so did not leave a voicemail.

## 2018-06-02 ENCOUNTER — Other Ambulatory Visit: Payer: Self-pay | Admitting: Family Medicine

## 2018-06-02 NOTE — Telephone Encounter (Signed)
Request for changes in prescription Please review

## 2018-06-07 MED ORDER — ATORVASTATIN CALCIUM 40 MG PO TABS: 40.0000 mg | ORAL_TABLET | Freq: Every day | ORAL | 1 refills | Status: DC

## 2018-06-07 NOTE — Telephone Encounter (Signed)
Fine to change 30d w/ 5 refills to 90d w/ 1 refill - this should not need a provider order.

## 2018-07-27 ENCOUNTER — Ambulatory Visit: Payer: BLUE CROSS/BLUE SHIELD | Admitting: Family Medicine

## 2018-07-30 ENCOUNTER — Telehealth: Payer: Self-pay | Admitting: Family Medicine

## 2018-07-30 NOTE — Telephone Encounter (Signed)
LVM for pt regarding their appt with Dr. Brigitte Pulse on 08/20/18. Due to her schedule changing, we need the pt to come later than the originally scheduled time.   OLD APPT: 11:20 NEW APPT: 1:00  If pt calls back, please put in their notes.  Thank you!

## 2018-08-03 ENCOUNTER — Ambulatory Visit: Payer: BLUE CROSS/BLUE SHIELD | Admitting: Family Medicine

## 2018-08-19 ENCOUNTER — Other Ambulatory Visit: Payer: Self-pay | Admitting: Family Medicine

## 2018-08-19 NOTE — Telephone Encounter (Signed)
Requested Prescriptions  Pending Prescriptions Disp Refills  . XIGDUO XR 02-999 MG TB24 [Pharmacy Med Name: XIGDUO XR 5 MG-1,000 MG TABLET] 90 tablet 0    Sig: TAKE 1 TABLET BY MOUTH EVERY DAY     Endocrinology:  Diabetes - Biguanide + SGLT2 Inhibitor Combos Failed - 08/19/2018  3:28 PM      Failed - LDL in normal range and within 360 days    LDL Calculated  Date Value Ref Range Status  03/24/2018 188 (H) 0 - 99 mg/dL Final         Failed - HBA1C is between 0 and 7.9 and within 180 days    Hemoglobin A1C  Date Value Ref Range Status  03/24/2018 8.2 (A) 4.0 - 5.6 % Final         Passed - Cr in normal range and within 360 days    Creatinine, Ser  Date Value Ref Range Status  03/24/2018 0.98 0.76 - 1.27 mg/dL Final         Passed - eGFR in normal range and within 360 days    GFR calc Af Amer  Date Value Ref Range Status  03/24/2018 100 >59 mL/min/1.73 Final   GFR calc non Af Amer  Date Value Ref Range Status  03/24/2018 86 >59 mL/min/1.73 Final         Passed - Valid encounter within last 6 months    Recent Outpatient Visits          4 months ago Type 2 diabetes mellitus with diabetic polyneuropathy, without long-term current use of insulin ([place])   [facility], Laurey Arrow, MD   1 year ago Type 2 diabetes mellitus without complication, without long-term current use of insulin [facility])   [facility], Laurey Arrow, MD   1 year ago Hyperlipidemia, unspecified hyperlipidemia type   [facility], Laurey Arrow, MD   1 year ago Annual physical exam   Primary Care at [facility], Laurey Arrow, MD      Future Appointments            In 5 days Shawnee Knapp, MD Primary Care at [place], [facility]

## 2018-08-24 ENCOUNTER — Ambulatory Visit: Payer: BLUE CROSS/BLUE SHIELD | Admitting: Family Medicine

## 2018-09-09 ENCOUNTER — Other Ambulatory Visit: Payer: Self-pay | Admitting: Family Medicine

## 2018-09-09 MED ORDER — ATORVASTATIN CALCIUM 40 MG PO TABS: 40.0000 mg | ORAL_TABLET | Freq: Every day | ORAL | 1 refills | Status: DC

## 2018-09-09 MED ORDER — ASPIRIN EC 81 MG PO TBEC: 81.0000 mg | DELAYED_RELEASE_TABLET | Freq: Every day | ORAL | Status: DC

## 2018-09-09 MED ORDER — DAPAGLIFLOZIN PRO-METFORMIN ER 5-1000 MG PO TB24: 1.0000 | ORAL_TABLET | Freq: Every day | ORAL | 0 refills | Status: DC

## 2018-09-09 NOTE — Telephone Encounter (Signed)
Requested Prescriptions  Pending Prescriptions Disp Refills  . Dapagliflozin-metFORMIN HCl ER (XIGDUO XR) 02-999 MG TB24 90 tablet 0    Sig: Take 1 tablet by mouth daily.     Endocrinology:  Diabetes - Biguanide + SGLT2 Inhibitor Combos Failed - 09/09/2018  1:57 PM      Failed - LDL in normal range and within 360 days    LDL Calculated  Date Value Ref Range Status  03/24/2018 188 (H) 0 - 99 mg/dL Final         Failed - HBA1C is between 0 and 7.9 and within 180 days    Hemoglobin A1C  Date Value Ref Range Status  03/24/2018 8.2 (A) 4.0 - 5.6 % Final         Passed - Cr in normal range and within 360 days    Creatinine, Ser  Date Value Ref Range Status  03/24/2018 0.98 0.76 - 1.27 mg/dL Final         Passed - eGFR in normal range and within 360 days    GFR calc Af Amer  Date Value Ref Range Status  03/24/2018 100 >59 mL/min/1.73 Final   GFR calc non Af Amer  Date Value Ref Range Status  03/24/2018 86 >59 mL/min/1.73 Final         Passed - Valid encounter within last 6 months    Recent Outpatient Visits          5 months ago Type 2 diabetes mellitus with diabetic polyneuropathy, without long-term current use of insulin ([place])   [facility] at [facility], Laurey Arrow, MD   1 year ago Type 2 diabetes mellitus without complication, without long-term current use of insulin [facility])   [facility], Laurey Arrow, MD   1 year ago Hyperlipidemia, unspecified hyperlipidemia type   [facility], Laurey Arrow, MD   1 year ago Annual physical exam   [facility], Laurey Arrow, MD      Future Appointments            In 1 month Shawnee Knapp, MD Primary Care at [place], [facility]         . atorvastatin (LIPITOR) 40 MG tablet 90 tablet 1    Sig: Take 1 tablet (40 mg total) by mouth daily.     Cardiovascular:  Antilipid - Statins Failed - 09/09/2018  1:57 PM      Failed - Total Cholesterol in normal range and within 360 days    Cholesterol, Total  Date Value Ref  Range Status  03/24/2018 262 (H) 100 - 199 mg/dL Final         Failed - LDL in normal range and within 360 days    LDL Calculated  Date Value Ref Range Status  03/24/2018 188 (H) 0 - 99 mg/dL Final         Failed - HDL in normal range and within 360 days    HDL  Date Value Ref Range Status  03/24/2018 38 (L) >39 mg/dL Final         Failed - Triglycerides in normal range and within 360 days    Triglycerides  Date Value Ref Range Status  03/24/2018 178 (H) 0 - 149 mg/dL Final         Passed - Patient is not pregnant      Passed - Valid encounter within last 12 months    Recent Outpatient Visits  5 months ago Type 2 diabetes mellitus with diabetic polyneuropathy, without long-term current use of insulin [facility])   [facility], Laurey Arrow, MD   1 year ago Type 2 diabetes mellitus without complication, without long-term current use of insulin [facility])   [facility] at [facility], Laurey Arrow, MD   1 year ago Hyperlipidemia, unspecified hyperlipidemia type   [facility] at [facility], Laurey Arrow, MD   1 year ago Annual physical exam   Primary Care at [facility], Laurey Arrow, MD      Future Appointments            In 1 month Shawnee Knapp, MD Primary Care at [place], [facility]         . aspirin EC 81 MG tablet      Sig: Take 1 tablet (81 mg total) by mouth daily.     Analgesics:  NSAIDS - aspirin Passed - 09/09/2018  1:57 PM      Passed - Patient is not pregnant      Passed - Valid encounter within last 12 months    Recent Outpatient Visits          5 months ago Type 2 diabetes mellitus with diabetic polyneuropathy, without long-term current use of insulin [facility])   [facility], Laurey Arrow, MD   1 year ago Type 2 diabetes mellitus without complication, without long-term current use of insulin [facility])   [facility] at [facility], Laurey Arrow, MD   1 year ago Hyperlipidemia, unspecified hyperlipidemia type   [facility], Laurey Arrow, MD   1 year ago Annual  physical exam   Primary Care at [facility], Laurey Arrow, MD      Future Appointments            In 1 month Brigitte Pulse [facility], MD Primary Care at [place], [facility]

## 2018-09-09 NOTE — Telephone Encounter (Signed)
Copied from [place] [id number]. Topic: Quick Communication - Rx Refill/Question >> Sep 09, 2018  1:52 PM [place], [place] D wrote: Medication: XIGDUO XR 02-999 MG Tb24 / atorvastatin (LIPITOR) 40 MG tablet / aspirin EC 81 MG tablet / Pharmacy advised pt to call office for refills  Has the patient contacted their pharmacy? Yes.   (Agent: If no, request that the patient contact the pharmacy for the refill.) (Agent: If yes, when and what did the pharmacy advise?)  Preferred Pharmacy (with phone number or street name): [id number] [place], [place] [address] [phone] (Phone) [phone] (Fax)    Agent: Please be advised that RX refills may take up to 3 business days. We ask that you follow-up with your pharmacy.

## 2018-10-23 ENCOUNTER — Telehealth: Payer: Self-pay | Admitting: Family Medicine

## 2018-10-23 NOTE — Telephone Encounter (Signed)
CALLED PATIENT TO INFORM HIM HE WILL BE SEEING A DIFFERENT PROVIDER DUE TO SHAW BEING OUT OF OFFICE .Marland Kitchen LVM 10/23/18

## 2018-10-26 ENCOUNTER — Ambulatory Visit: Payer: BLUE CROSS/BLUE SHIELD | Admitting: Emergency Medicine

## 2018-10-26 ENCOUNTER — Ambulatory Visit: Payer: BLUE CROSS/BLUE SHIELD | Admitting: Family Medicine

## 2018-12-21 ENCOUNTER — Other Ambulatory Visit: Payer: Self-pay | Admitting: Family Medicine

## 2018-12-21 NOTE — Telephone Encounter (Signed)
Requested Prescriptions  Pending Prescriptions Disp Refills  . XIGDUO XR 02-999 MG TB24 [Pharmacy Med Name: XIGDUO XR 5 MG-1,000 MG TABLET] 30 tablet 0    Sig: TAKE 1 TABLET BY MOUTH EVERY DAY     Endocrinology:  Diabetes - Biguanide + SGLT2 Inhibitor Combos Failed - 12/21/2018 10:41 AM      Failed - LDL in normal range and within 360 days    LDL Calculated  Date Value Ref Range Status  03/24/2018 188 (H) 0 - 99 mg/dL Final         Failed - HBA1C is between 0 and 7.9 and within 180 days    Hemoglobin A1C  Date Value Ref Range Status  03/24/2018 8.2 (A) 4.0 - 5.6 % Final         Failed - Valid encounter within last 6 months    Recent Outpatient Visits          9 months ago Type 2 diabetes mellitus with diabetic polyneuropathy, without long-term current use of insulin ([place])   [facility], Laurey Arrow, MD   1 year ago Type 2 diabetes mellitus without complication, without long-term current use of insulin [facility])   [facility] at [facility], Laurey Arrow, MD   2 years ago Hyperlipidemia, unspecified hyperlipidemia type   [facility], Laurey Arrow, MD   2 years ago Annual physical exam   [facility] at [facility], Laurey Arrow, MD      Future Appointments            In 3 weeks Rutherford Guys, MD Primary Care at [place], [place] in normal range and within 360 days    Creatinine, Ser  Date Value Ref Range Status  03/24/2018 0.98 0.76 - 1.27 mg/dL Final         Passed - eGFR in normal range and within 360 days    GFR calc Af Amer  Date Value Ref Range Status  03/24/2018 100 >59 mL/min/1.73 Final   GFR calc non Af Amer  Date Value Ref Range Status  03/24/2018 86 >59 mL/min/1.73 Final       has appt. 01/14/19 with Dr. Pamella Pert;  Gave pt. 30 day courtesy refill until next appt.

## 2019-01-08 ENCOUNTER — Other Ambulatory Visit: Payer: Self-pay

## 2019-01-08 DIAGNOSIS — Z13228 Encounter for screening for other metabolic disorders: Secondary | ICD-10-CM

## 2019-01-08 DIAGNOSIS — Z130 Encounter for screening for diseases of the blood and blood-forming organs and certain disorders involving the immune mechanism: Secondary | ICD-10-CM

## 2019-01-08 DIAGNOSIS — Z1329 Encounter for screening for other suspected endocrine disorder: Secondary | ICD-10-CM

## 2019-01-08 DIAGNOSIS — Z13220 Encounter for screening for lipoid disorders: Secondary | ICD-10-CM

## 2019-01-08 DIAGNOSIS — Z1389 Encounter for screening for other disorder: Secondary | ICD-10-CM

## 2019-01-08 DIAGNOSIS — E1142 Type 2 diabetes mellitus with diabetic polyneuropathy: Secondary | ICD-10-CM

## 2019-01-12 ENCOUNTER — Other Ambulatory Visit: Payer: Self-pay | Admitting: Family Medicine

## 2019-01-12 NOTE — Telephone Encounter (Signed)
On 12/21/18 pt was only given #30, please advise on refill request. Looks like a former [place] pt

## 2019-01-14 ENCOUNTER — Other Ambulatory Visit: Payer: Self-pay

## 2019-01-14 ENCOUNTER — Telehealth: Payer: BLUE CROSS/BLUE SHIELD | Admitting: Family Medicine

## 2019-01-14 DIAGNOSIS — E785 Hyperlipidemia, unspecified: Secondary | ICD-10-CM

## 2019-01-14 DIAGNOSIS — E1142 Type 2 diabetes mellitus with diabetic polyneuropathy: Secondary | ICD-10-CM

## 2019-02-02 ENCOUNTER — Other Ambulatory Visit: Payer: Self-pay | Admitting: Family Medicine

## 2019-02-02 NOTE — Telephone Encounter (Signed)
Requested medication (s) are due for refill today: yes  Requested medication (s) are on the active medication list: yes  Last refill:  12/21/2018  Future visit scheduled: No    Notes to clinic:  Last order was courtesy refill. Pt no show for appointment 4/2.    Requested Prescriptions  Pending Prescriptions Disp Refills   XIGDUO XR 02-999 MG TB24 [Pharmacy Med Name: XIGDUO XR 5 MG-1,000 MG TABLET] 30 tablet 0    Sig: TAKE 1 TABLET BY MOUTH EVERY DAY     Endocrinology:  Diabetes - Biguanide + SGLT2 Inhibitor Combos Failed - 02/02/2019  2:34 PM      Failed - LDL in normal range and within 360 days    LDL Calculated  Date Value Ref Range Status  03/24/2018 188 (H) 0 - 99 mg/dL Final         Failed - HBA1C is between 0 and 7.9 and within 180 days    Hemoglobin A1C  Date Value Ref Range Status  03/24/2018 8.2 (A) 4.0 - 5.6 % Final         Failed - Valid encounter within last 6 months    Recent Outpatient Visits          10 months ago Type 2 diabetes mellitus with diabetic polyneuropathy, without long-term current use of insulin ([place])   [facility] at [facility], Laurey Arrow, MD   2 years ago Type 2 diabetes mellitus without complication, without long-term current use of insulin [facility])   [facility], Laurey Arrow, MD   2 years ago Hyperlipidemia, unspecified hyperlipidemia type   [facility], Laurey Arrow, MD   2 years ago Annual physical exam   [facility] at [facility], Laurey Arrow, MD             Passed - Cr in normal range and within 360 days    Creatinine, Ser  Date Value Ref Range Status  03/24/2018 0.98 0.76 - 1.27 mg/dL Final         Passed - eGFR in normal range and within 360 days    GFR calc Af Amer  Date Value Ref Range Status  03/24/2018 100 >59 mL/min/1.73 Final   GFR calc non Af Amer  Date Value Ref Range Status  03/24/2018 86 >59 mL/min/1.73 Final

## 2019-02-03 ENCOUNTER — Telehealth: Payer: Self-pay | Admitting: Family Medicine

## 2019-02-03 ENCOUNTER — Other Ambulatory Visit: Payer: Self-pay | Admitting: Family Medicine

## 2019-02-03 NOTE — Telephone Encounter (Signed)
Copied from [place] [id number]. Topic: Quick Communication - Rx Refill/Question >> Feb 03, 2019  4:45 PM [name] wrote: Medication:  XIGDUO XR 02-999 MG TB24   Has the patient contacted their pharmacy? Yes.   (Agent: If no, request that the patient contact the pharmacy for the refill.) (Agent: If yes, when and what did the pharmacy advise?)  Preferred Pharmacy (with phone number [facility] street name[id number] [place], [place] [address] [phone] (Phone) [phone] (Fax)    Agent: Please be advised that RX refills may take up to 3 business days. We ask that you follow-up with your pharmacy.

## 2019-02-03 NOTE — Telephone Encounter (Signed)
Patient called requesting a call back for an appointment to be made for today so he does not run out of medication

## 2019-02-04 NOTE — Telephone Encounter (Signed)
Medication 30 day supply sent to pharmacy until he is seen in office.

## 2019-02-04 NOTE — Telephone Encounter (Signed)
A 30 day supply of medication has been sent to pharmacy as a courtesy refill Until he is seen. Next appointment is with you,

## 2019-02-05 NOTE — Telephone Encounter (Signed)
Thank you! Looking forward to speaking with him.

## 2019-02-08 ENCOUNTER — Telehealth: Payer: BLUE CROSS/BLUE SHIELD | Admitting: Registered Nurse

## 2019-02-08 ENCOUNTER — Other Ambulatory Visit: Payer: Self-pay

## 2019-02-08 ENCOUNTER — Telehealth: Payer: Self-pay | Admitting: Registered Nurse

## 2019-02-08 NOTE — Telephone Encounter (Signed)
02/08/2019 - PATIENT HAD A TELEMED ON Monday 02/08/2019 AT 3:00pm TO TRANSFER HIS CARE TO RICHARD MORROW. HE WAS A NO-SHOW. I TRIED TO CALL AND RESCHEDULE BUT HAD TO LEAVE HIM A VOICE MAIL TO RETURN OUR CALL. Cameron James

## 2019-02-25 ENCOUNTER — Other Ambulatory Visit: Payer: Self-pay | Admitting: Registered Nurse

## 2019-03-02 ENCOUNTER — Other Ambulatory Visit: Payer: Self-pay

## 2019-03-02 ENCOUNTER — Telehealth (INDEPENDENT_AMBULATORY_CARE_PROVIDER_SITE_OTHER): Payer: BLUE CROSS/BLUE SHIELD | Admitting: Registered Nurse

## 2019-03-02 ENCOUNTER — Encounter: Payer: Self-pay | Admitting: Registered Nurse

## 2019-03-02 DIAGNOSIS — E785 Hyperlipidemia, unspecified: Secondary | ICD-10-CM

## 2019-03-02 DIAGNOSIS — E1142 Type 2 diabetes mellitus with diabetic polyneuropathy: Secondary | ICD-10-CM | POA: Diagnosis not present

## 2019-03-02 MED ORDER — ATORVASTATIN CALCIUM 40 MG PO TABS: 40.0000 mg | ORAL_TABLET | Freq: Every day | ORAL | 1 refills | Status: DC

## 2019-03-02 MED ORDER — DAPAGLIFLOZIN PRO-METFORMIN ER 5-1000 MG PO TB24: 1.0000 | ORAL_TABLET | Freq: Every day | ORAL | 1 refills | Status: DC

## 2019-03-02 NOTE — Progress Notes (Signed)
Telemedicine Encounter- SOAP NOTE Established Patient  This telephone encounter was conducted with the patient's (or proxy's) verbal consent via audio telecommunications: yes  Patient was instructed to have this encounter in a suitably private space; and to only have persons present to whom they give permission to participate. In addition, patient identity was confirmed by use of name plus two identifiers (DOB and address).  I discussed the limitations, risks, security and privacy concerns of performing an evaluation and management service by telephone and the availability of in person appointments. I also discussed with the patient that there may be a patient responsible charge related to this service. The patient expressed understanding and agreed to proceed.  I spent a total of 18 minutes talking with the patient or their proxy.    Subjective   Cameron James is a 58 y.o. established patient. Telephone visit today to establish care with myself as PCP and med refills  Pt has medical history significant for T2DM and HLD.   F5DD complicated by neuropathy: Currently on Xigduo. Failed metformin in the past. Admits that he does not routinely check cbgs - encouraged further monitoring, will present to clinic soon for A1c and again in 6 mos for follow up. Denies sxs of hypoglycemia and hyperglycemia.  HLD: Atorvastatin 18m daily, Lipid Panel         Component                Value               Date/Time                 CHOL                     262 (H)             03/24/2018 1715           TRIG                     178 (H)             03/24/2018 1715           HDL                      38 (L)              03/24/2018 1715           CHOLHDL                  6.9 (H)             03/24/2018 1715           LDLCALC                  188 (H)             03/24/2018 1715       will recheck with lab visit and again in 6 mos. Dose increase may be needed based on lab results.  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  He smokes cigars occasionally, though he has recently noticed that this is becoming a daily habit - we discussed the benefit of smoking cessation now, before it becomes truly habitual or an issue of dependency.  Patient Active Problem List   Diagnosis Date Noted  . Type 2 diabetes mellitus without complication, without long-term current use of insulin ([place]) 05/21/2017  . Hyperlipidemia LDL goal <100 05/21/2017  . Class 1 obesity due to excess calories with serious comorbidity and body mass index (BMI) of 32.0 to 32.9 in adult 05/21/2017    Past Medical History:  Diagnosis Date  . Blood in stool    long ago- polyps removed per pt- > 15 yrs ago   . Diabetes mellitus without complication (HCC)    borderline- on metformin  . Hyperlipidemia     Current Outpatient Medications  Medication Sig Dispense Refill  . aspirin EC 81 MG tablet Take 1 tablet (81 mg total) by mouth daily.    Marland Kitchen atorvastatin (LIPITOR) 40 MG tablet Take 1 tablet (40 mg total) by mouth daily. 90 tablet 1  . blood glucose meter kit and supplies KIT One touch ultra. Use up to four times daily as directed. (FOR ICD-10 E11.9). 1 each 0  . Continuous Blood Gluc Receiver (FREESTYLE LIBRE 14 DAY READER) DEVI 1 Units by Does not apply route 6 (six) times daily. Due to hyperglycemia, med changes, diet adjustments Dx: E11.42 1 Device 0  . Continuous Blood Gluc Sensor (FREESTYLE LIBRE 14 DAY SENSOR) MISC 1 Units by Does not apply route 6 (six) times daily. Due to hyperglycemia, med changes, diet adjustments Dx: E11.42 2 each 11  . Vitamin D, Ergocalciferol, (DRISDOL) 50000 units CAPS capsule     . XIGDUO XR 02-999 MG TB24 TAKE 1 TABLET BY MOUTH EVERY DAY 30 tablet 0   No current facility-administered medications for this visit.     No Known Allergies  Social History    Socioeconomic History  . Marital status: Divorced    Spouse name: Not on file  . Number of children: 1  . Years of education: Not on file  . Highest education level: Not on file  Occupational History  . Not on file  Social Needs  . Financial resource strain: Not hard at all  . Food insecurity:    Worry: Never true    Inability: Never true  . Transportation needs:    Medical: No    Non-medical: No  Tobacco Use  . Smoking status: Current Every Day Smoker    Packs/day: 0.10    Types: Cigars  . Smokeless tobacco: Never Used  Substance and Sexual Activity  . Alcohol use: Yes    Alcohol/week: 2.0 standard drinks    Types: 1 Cans of beer, 1 Shots of liquor per week  . Drug use: No  . Sexual activity: Not Currently  Lifestyle  . Physical activity:    Days per week: 0 days    Minutes per session: 0 min  . Stress: Only a little  Relationships  . Social connections:    Talks on phone: Three times a week    Gets together: Three times a week    Attends religious service: 1 to 4 times per year    Active member of club or organization: No    Attends meetings of clubs or organizations: Never    Relationship status: Living with partner  . Intimate partner violence:    Fear of current or ex partner: No    Emotionally abused: No    Physically abused: No    Forced sexual activity: No  Other Topics Concern  . Not on file  Social History Narrative  . Not on file  Review of Systems  Constitutional: Negative.   HENT: Negative.   Eyes: Negative.   Respiratory: Negative.   Cardiovascular: Negative.   Gastrointestinal: Negative.   Genitourinary: Negative.   Musculoskeletal: Negative.   Skin: Negative.   Neurological: Negative.   Endo/Heme/Allergies: Negative.   Psychiatric/Behavioral: Negative.     Objective   Vitals as reported by the patient: There were no vitals filed for this visit.  Diagnoses and all orders for this visit:  Type 2 diabetes mellitus with diabetic  polyneuropathy, without long-term current use of insulin ([place])  Hyperlipidemia LDL goal <100  PLAN:  Needs labs soon - overdue. Orders in.  Needs labs and CPE in about six months time - will make medication regimen changes at that time given patient's adherence to therapy and lab result changes between today's visit and then.   Discussed lifestyle modifications to improve chronic conditions - diet and exercise, smoking cessation, etc.  Patient encouraged to call clinic with any questions, comments, or concerns.     I discussed the assessment and treatment plan with the patient. The patient was provided an opportunity to ask questions and all were answered. The patient agreed with the plan and demonstrated an understanding of the instructions.   The patient was advised to call back or seek an in-person evaluation if the symptoms worsen or if the condition fails to improve as anticipated.  I provided 18 minutes of non-face-to-face time during this encounter, more than 50% of which was spent counseling/educating   Thank you for your visit with Primary Care at [facility] today.  Maximiano Coss, NP [facility] at [address], [place] [address] [phone]

## 2019-03-02 NOTE — Progress Notes (Signed)
Spoke with pt and he needs to [facility] for new PCP to manage medications. He will need refills today on medication. Pt states there is no other concerns.

## 2019-03-23 NOTE — Telephone Encounter (Signed)
TOC to [employer]

## 2019-07-05 ENCOUNTER — Other Ambulatory Visit: Payer: Self-pay | Admitting: Registered Nurse

## 2019-07-05 DIAGNOSIS — E1142 Type 2 diabetes mellitus with diabetic polyneuropathy: Secondary | ICD-10-CM

## 2019-07-05 DIAGNOSIS — E785 Hyperlipidemia, unspecified: Secondary | ICD-10-CM

## 2019-08-23 ENCOUNTER — Encounter: Payer: BLUE CROSS/BLUE SHIELD | Admitting: Registered Nurse

## 2019-08-24 ENCOUNTER — Encounter: Payer: Self-pay | Admitting: Registered Nurse

## 2020-03-26 ENCOUNTER — Other Ambulatory Visit: Payer: Self-pay | Admitting: Registered Nurse

## 2020-03-26 DIAGNOSIS — E785 Hyperlipidemia, unspecified: Secondary | ICD-10-CM

## 2020-03-27 NOTE — Telephone Encounter (Signed)
No further refills without an office visit 

## 2020-03-27 NOTE — Telephone Encounter (Signed)
Please schedule an office visit for any further refills 30 day supply has been sent

## 2020-03-27 NOTE — Telephone Encounter (Signed)
LVMTCB to sch med refill appt °

## 2020-03-31 ENCOUNTER — Other Ambulatory Visit: Payer: Self-pay | Admitting: Registered Nurse

## 2020-03-31 DIAGNOSIS — E1142 Type 2 diabetes mellitus with diabetic polyneuropathy: Secondary | ICD-10-CM

## 2020-04-25 ENCOUNTER — Telehealth: Payer: Self-pay

## 2020-04-25 ENCOUNTER — Ambulatory Visit: Payer: BC Managed Care – PPO | Admitting: Registered Nurse

## 2020-04-25 ENCOUNTER — Telehealth: Payer: Self-pay | Admitting: Registered Nurse

## 2020-04-25 ENCOUNTER — Other Ambulatory Visit: Payer: Self-pay

## 2020-04-25 VITALS — BP 127/81 | HR 80 | Temp 98.0°F | Ht 73.0 in | Wt 230.2 lb

## 2020-04-25 DIAGNOSIS — E1142 Type 2 diabetes mellitus with diabetic polyneuropathy: Secondary | ICD-10-CM

## 2020-04-25 LAB — POCT URINALYSIS DIP (MANUAL ENTRY)
Bilirubin, UA: NEGATIVE
Blood, UA: NEGATIVE
Glucose, UA: 500 mg/dL — AB
Ketones, POC UA: NEGATIVE mg/dL
Leukocytes, UA: NEGATIVE
Nitrite, UA: NEGATIVE
Protein Ur, POC: NEGATIVE mg/dL
Spec Grav, UA: 1.02 (ref 1.010–1.025)
Urobilinogen, UA: 0.2 E.U./dL
pH, UA: 5.5 (ref 5.0–8.0)

## 2020-04-25 LAB — POCT GLYCOSYLATED HEMOGLOBIN (HGB A1C)
HbA1c POC (<> result, manual entry): 7.1 % (ref 4.0–5.6)
HbA1c, POC (controlled diabetic range): 7.1 % — AB (ref 0.0–7.0)
HbA1c, POC (prediabetic range): 7.1 % — AB (ref 5.7–6.4)
Hemoglobin A1C: 7.1 % — AB (ref 4.0–5.6)

## 2020-04-25 MED ORDER — XIGDUO XR 5-1000 MG PO TB24: 1.0000 | ORAL_TABLET | Freq: Every day | ORAL | 1 refills | Status: DC

## 2020-04-25 NOTE — Telephone Encounter (Signed)
When he fills his release out-I'll print off his medical records.

## 2020-04-25 NOTE — Telephone Encounter (Signed)
Pt. Came into office request itemized bills for all visits for the year 2019. Mailing address on pt. Account was confirmed to be accurate.

## 2020-04-25 NOTE — Patient Instructions (Signed)
° ° ° °  If you have lab work done today you will be contacted with your lab results within the next 2 weeks.  If you have not heard from us then please contact us. The fastest way to get your results is to register for My Chart. ° ° °IF you received an x-ray today, you will receive an invoice from [facility]. Please contact [facility] at [phone] with questions or concerns regarding your invoice.  ° °IF you received labwork today, you will receive an invoice from [facility]. Please contact [facility] at [phone] with questions or concerns regarding your invoice.  ° °Our billing staff will not be able to assist you with questions regarding bills from these companies. ° °You will be contacted with the lab results as soon as they are available. The fastest way to get your results is to activate your My Chart account. Instructions are located on the last page of this paperwork. If you have not heard from us regarding the results in 2 weeks, please contact this office. °  ° ° ° °

## 2020-04-25 NOTE — Telephone Encounter (Signed)
Pt will be coming by to fill out release and needing records from 2019 /

## 2020-04-25 NOTE — Telephone Encounter (Signed)
Error

## 2020-04-26 LAB — CBC WITH DIFFERENTIAL/PLATELET
Basophils Absolute: 0 x10E3/uL (ref 0.0–0.2)
Basos: 0 %
EOS (ABSOLUTE): 0.1 x10E3/uL (ref 0.0–0.4)
Eos: 1 %
Hematocrit: 49.2 % (ref 37.5–51.0)
Hemoglobin: 16.6 g/dL (ref 13.0–17.7)
Immature Grans (Abs): 0 x10E3/uL (ref 0.0–0.1)
Immature Granulocytes: 0 %
Lymphocytes Absolute: 2.3 x10E3/uL (ref 0.7–3.1)
Lymphs: 28 %
MCH: 29.8 pg (ref 26.6–33.0)
MCHC: 33.7 g/dL (ref 31.5–35.7)
MCV: 88 fL (ref 79–97)
Monocytes Absolute: 0.6 x10E3/uL (ref 0.1–0.9)
Monocytes: 7 %
Neutrophils Absolute: 5.2 x10E3/uL (ref 1.4–7.0)
Neutrophils: 64 %
Platelets: 216 x10E3/uL (ref 150–450)
RBC: 5.57 x10E6/uL (ref 4.14–5.80)
RDW: 13 % (ref 11.6–15.4)
WBC: 8.3 x10E3/uL (ref 3.4–10.8)

## 2020-04-26 LAB — COMPREHENSIVE METABOLIC PANEL
ALT: 26 IU/L (ref 0–44)
AST: 19 IU/L (ref 0–40)
Albumin/Globulin Ratio: 1.8 (ref 1.2–2.2)
Albumin: 4.4 g/dL (ref 3.8–4.9)
Alkaline Phosphatase: 75 IU/L (ref 48–121)
BUN/Creatinine Ratio: 16 (ref 9–20)
BUN: 15 mg/dL (ref 6–24)
Bilirubin Total: 0.4 mg/dL (ref 0.0–1.2)
CO2: 20 mmol/L (ref 20–29)
Calcium: 9.6 mg/dL (ref 8.7–10.2)
Chloride: 107 mmol/L — ABNORMAL HIGH (ref 96–106)
Creatinine, Ser: 0.95 mg/dL (ref 0.76–1.27)
GFR calc Af Amer: 102 mL/min/{1.73_m2} (ref >59)
GFR calc non Af Amer: 88 mL/min/{1.73_m2} (ref >59)
Globulin, Total: 2.4 g/dL (ref 1.5–4.5)
Glucose: 133 mg/dL — ABNORMAL HIGH (ref 65–99)
Potassium: 4.3 mmol/L (ref 3.5–5.2)
Sodium: 143 mmol/L (ref 134–144)
Total Protein: 6.8 g/dL (ref 6.0–8.5)

## 2020-04-26 LAB — TSH: TSH: 1.3 u[IU]/mL (ref 0.450–4.500)

## 2020-04-26 LAB — LIPID PANEL
Chol/HDL Ratio: 4.2 ratio (ref 0.0–5.0)
Cholesterol, Total: 172 mg/dL (ref 100–199)
HDL: 41 mg/dL (ref >39)
LDL Chol Calc (NIH): 111 mg/dL — ABNORMAL HIGH (ref 0–99)
Triglycerides: 108 mg/dL (ref 0–149)
VLDL Cholesterol Cal: 20 mg/dL (ref 5–40)

## 2020-04-26 LAB — MICROALBUMIN, URINE: Microalbumin, Urine: <3 ug/mL

## 2020-04-26 NOTE — Progress Notes (Signed)
Normal results letter, please!  Thank you  Kathrin Ruddy, NP

## 2020-05-09 ENCOUNTER — Other Ambulatory Visit: Payer: Self-pay | Admitting: Registered Nurse

## 2020-05-09 DIAGNOSIS — E785 Hyperlipidemia, unspecified: Secondary | ICD-10-CM

## 2020-05-31 ENCOUNTER — Encounter: Payer: Self-pay | Admitting: Registered Nurse

## 2020-05-31 NOTE — Progress Notes (Signed)
Established Patient Office Visit  Subjective:  Patient ID: Cameron James, male    DOB: [birth year]  Age: 59 y.o. MRN: [id number]  CC:  Chief Complaint  Patient presents with   Follow-up    medical conditions    HPI [name] presents for follow up on t2dm  Hx of t2dm. Has been under moderate control in past. Pt notes that he has taken better lifestyle steps to control his sugars. Not checking sugars. Does not complain of Z6SA complications at this time. Has been on Xigduo 5-$RemoveBef'1000mg'GGGVxNdCwT$  PO qd.   No other concerns today.    Past Medical History:  Diagnosis Date   Blood in stool    long ago- polyps removed per pt- > 15 yrs ago    Diabetes mellitus without complication (HCC)    borderline- on metformin   Hyperlipidemia     Past Surgical History:  Procedure Laterality Date   POLYPECTOMY     in [place]- [place]- pt unsure when > 15 yrs ago     Family History  Problem Relation Age of Onset   Diabetes Mother    Hypertension Mother    Heart disease Father    Colon cancer Neg Hx    Colon polyps Neg Hx    Esophageal cancer Neg Hx    Rectal cancer Neg Hx    Stomach cancer Neg Hx     Social History   Socioeconomic History   Marital status: Divorced    Spouse name: Not on file   Number of children: 1   Years of education: Not on file   Highest education level: Not on file  Occupational History   Not on file  Tobacco Use   Smoking status: Current Every Day Smoker    Packs/day: 0.10    Types: Cigars   Smokeless tobacco: Never Used  Vaping Use   Vaping Use: Never used  Substance and Sexual Activity   Alcohol use: Yes    Alcohol/week: 2.0 standard drinks    Types: 1 Cans of beer, 1 Shots of liquor per week   Drug use: No   Sexual activity: Not Currently  Other Topics Concern   Not on file  Social History Narrative   Not on file   Social Determinants of Health   Financial Resource Strain:    Difficulty of Paying Living Expenses:     Food Insecurity:    Worried About Charity fundraiser in the Last Year:    Arboriculturist in the Last Year:   Transportation Needs:    Film/video editor (Medical):    Lack of Transportation (Non-Medical):   Physical Activity:    Days of Exercise per Week:    Minutes of Exercise per Session:   Stress:    Feeling of Stress :   Social Connections:    Frequency of Communication with Friends and Family:    Frequency of Social Gatherings with Friends and Family:    Attends Religious Services:    Active Member of Clubs or Organizations:    Attends Music therapist:    Marital Status:   Intimate Partner Violence:    Fear of Current or Ex-Partner:    Emotionally Abused:    Physically Abused:    Sexually Abused:     Outpatient Medications Prior to Visit  Medication Sig Dispense Refill   aspirin EC 81 MG tablet Take 1 tablet (81 mg total) by mouth daily.     blood glucose meter  kit and supplies KIT One touch ultra. Use up to four times daily as directed. (FOR ICD-10 E11.9). 1 each 0   Continuous Blood Gluc Receiver (FREESTYLE LIBRE 14 DAY READER) DEVI 1 Units by Does not apply route 6 (six) times daily. Due to hyperglycemia, med changes, diet adjustments Dx: E11.42 1 Device 0   Continuous Blood Gluc Sensor (FREESTYLE LIBRE 14 DAY SENSOR) MISC 1 Units by Does not apply route 6 (six) times daily. Due to hyperglycemia, med changes, diet adjustments Dx: E11.42 2 each 11   Vitamin D, Ergocalciferol, (DRISDOL) 50000 units CAPS capsule      atorvastatin (LIPITOR) 40 MG tablet TAKE 1 TABLET BY MOUTH EVERY DAY 30 tablet 0   XIGDUO XR 02-999 MG TB24 TAKE 1 TABLET BY MOUTH EVERY DAY 90 tablet 1   No facility-administered medications prior to visit.    No Known Allergies  ROS Review of Systems  Constitutional: Negative.   HENT: Negative.   Eyes: Negative.   Respiratory: Negative.   Cardiovascular: Negative.   Gastrointestinal: Negative.    Endocrine: Negative.   Genitourinary: Negative.   Musculoskeletal: Negative.   Skin: Negative.   Allergic/Immunologic: Negative.   Neurological: Negative.   Hematological: Negative.   Psychiatric/Behavioral: Negative.   All other systems reviewed and are negative.     Objective:    Physical Exam Vitals and nursing note reviewed.  Constitutional:      General: He is not in acute distress.    Appearance: Normal appearance. He is normal weight. He is not ill-appearing, toxic-appearing or diaphoretic.  Cardiovascular:     Rate and Rhythm: Normal rate and regular rhythm.  Pulmonary:     Effort: Pulmonary effort is normal. No respiratory distress.  Neurological:     Mental Status: He is alert.  Psychiatric:        Mood and Affect: Mood normal.        Behavior: Behavior normal.        Thought Content: Thought content normal.        Judgment: Judgment normal.     BP 127/81 (BP Location: Right Arm, Patient Position: Sitting, Cuff Size: Large)    Pulse 80    Temp 98 F (36.7 C) (Temporal)    Ht $R'6\' 1"'Go$  (1.854 m)    Wt 230 lb 3.2 oz (104.4 kg)    SpO2 98%    BMI 30.37 kg/m  Wt Readings from Last 3 Encounters:  04/25/20 230 lb 3.2 oz (104.4 kg)  03/24/18 245 lb 3.2 oz (111.2 kg)  01/20/17 244 lb 12.8 oz (111 kg)     Health Maintenance Due  Topic Date Due   OPHTHALMOLOGY EXAM  Never done   COVID-19 Vaccine (1) Never done   FOOT EXAM  03/25/2019   INFLUENZA VACCINE  05/14/2020    There are no preventive care reminders to display for this patient.  Lab Results  Component Value Date   TSH 1.300 04/25/2020   Lab Results  Component Value Date   WBC 8.3 04/25/2020   HGB 16.6 04/25/2020   HCT 49.2 04/25/2020   MCV 88 04/25/2020   PLT 216 04/25/2020   Lab Results  Component Value Date   NA 143 04/25/2020   K 4.3 04/25/2020   CO2 20 04/25/2020   GLUCOSE 133 (H) 04/25/2020   BUN 15 04/25/2020   CREATININE 0.95 04/25/2020   BILITOT 0.4 04/25/2020   ALKPHOS 75  04/25/2020   AST 19 04/25/2020   ALT 26 04/25/2020  PROT 6.8 04/25/2020   ALBUMIN 4.4 04/25/2020   CALCIUM 9.6 04/25/2020   Lab Results  Component Value Date   CHOL 172 04/25/2020   Lab Results  Component Value Date   HDL 41 04/25/2020   Lab Results  Component Value Date   LDLCALC 111 (H) 04/25/2020   Lab Results  Component Value Date   TRIG 108 04/25/2020   Lab Results  Component Value Date   CHOLHDL 4.2 04/25/2020   Lab Results  Component Value Date   HGBA1C 7.1 (A) 04/25/2020   HGBA1C 7.1 04/25/2020   HGBA1C 7.1 (A) 04/25/2020   HGBA1C 7.1 (A) 04/25/2020      Assessment & Plan:   Problem List Items Addressed This Visit    None    Visit Diagnoses    Type 2 diabetes mellitus with diabetic polyneuropathy, without long-term current use of insulin (HCC)    -  Primary   Relevant Medications   Dapagliflozin-metFORMIN HCl ER (XIGDUO XR) 02-999 MG TB24   Other Relevant Orders   POCT glycosylated hemoglobin (Hb A1C) (Completed)   TSH (Completed)   Lipid panel (Completed)   Comprehensive metabolic panel (Completed)   CBC with Differential (Completed)   Microalbumin, urine (Completed)   Ambulatory referral to Ophthalmology   POCT urinalysis dipstick (Completed)      Meds ordered this encounter  Medications   Dapagliflozin-metFORMIN HCl ER (XIGDUO XR) 02-999 MG TB24    Sig: Take 1 tablet by mouth daily.    Dispense:  90 tablet    Refill:  1    Order Specific Question:   Supervising Provider    AnswerRutherford Guys [[id number]]    Follow-up: No follow-ups on file.   PLAN  a1c improved to 7.1 from last reading of 8.2, taken 2 years ago  Patient due for t2dm eye exam - referral to be sent.  Refill xigduo  Follow up in 6 mo  Patient encouraged to call clinic with any questions, comments, or concerns.  Maximiano Coss, NP

## 2020-12-11 ENCOUNTER — Observation Stay (HOSPITAL_COMMUNITY)
Admission: EM | Admit: 2020-12-11 | Discharge: 2020-12-12 | Disposition: A | Discharge status: Home / Self Care | Payer: BC Managed Care – PPO | Attending: Vascular Surgery | Admitting: Vascular Surgery

## 2020-12-11 ENCOUNTER — Emergency Department (HOSPITAL_COMMUNITY): Payer: BC Managed Care – PPO

## 2020-12-11 ENCOUNTER — Encounter (HOSPITAL_COMMUNITY): Payer: Self-pay | Admitting: Emergency Medicine

## 2020-12-11 ENCOUNTER — Other Ambulatory Visit: Payer: Self-pay

## 2020-12-11 ENCOUNTER — Observation Stay (HOSPITAL_COMMUNITY): Payer: BC Managed Care – PPO

## 2020-12-11 DIAGNOSIS — F17290 Nicotine dependence, other tobacco product, uncomplicated: Secondary | ICD-10-CM | POA: Insufficient documentation

## 2020-12-11 DIAGNOSIS — Z7901 Long term (current) use of anticoagulants: Secondary | ICD-10-CM | POA: Diagnosis not present

## 2020-12-11 DIAGNOSIS — Z7984 Long term (current) use of oral hypoglycemic drugs: Secondary | ICD-10-CM | POA: Diagnosis not present

## 2020-12-11 DIAGNOSIS — Z79899 Other long term (current) drug therapy: Secondary | ICD-10-CM | POA: Diagnosis not present

## 2020-12-11 DIAGNOSIS — I82409 Acute embolism and thrombosis of unspecified deep veins of unspecified lower extremity: Secondary | ICD-10-CM | POA: Diagnosis present

## 2020-12-11 DIAGNOSIS — I82441 Acute embolism and thrombosis of right tibial vein: Secondary | ICD-10-CM | POA: Insufficient documentation

## 2020-12-11 DIAGNOSIS — M79604 Pain in right leg: Secondary | ICD-10-CM | POA: Diagnosis present

## 2020-12-11 DIAGNOSIS — E119 Type 2 diabetes mellitus without complications: Secondary | ICD-10-CM | POA: Diagnosis not present

## 2020-12-11 DIAGNOSIS — I82401 Acute embolism and thrombosis of unspecified deep veins of right lower extremity: Secondary | ICD-10-CM | POA: Diagnosis not present

## 2020-12-11 DIAGNOSIS — I82411 Acute embolism and thrombosis of right femoral vein: Principal | ICD-10-CM | POA: Insufficient documentation

## 2020-12-11 DIAGNOSIS — Z20822 Contact with and (suspected) exposure to covid-19: Secondary | ICD-10-CM | POA: Diagnosis not present

## 2020-12-11 LAB — CBC WITH DIFFERENTIAL/PLATELET
Abs Immature Granulocytes: 0.04 10*3/uL (ref 0.00–0.07)
Basophils Absolute: 0 10*3/uL (ref 0.0–0.1)
Basophils Relative: 0 %
Eosinophils Absolute: 0.2 10*3/uL (ref 0.0–0.5)
Eosinophils Relative: 2 %
HCT: 46.4 % (ref 39.0–52.0)
Hemoglobin: 15 g/dL (ref 13.0–17.0)
Immature Granulocytes: 0 %
Lymphocytes Relative: 31 %
Lymphs Abs: 2.7 10*3/uL (ref 0.7–4.0)
MCH: 27.8 pg (ref 26.0–34.0)
MCHC: 32.3 g/dL (ref 30.0–36.0)
MCV: 86.1 fL (ref 80.0–100.0)
Monocytes Absolute: 0.7 10*3/uL (ref 0.1–1.0)
Monocytes Relative: 7 %
Neutro Abs: 5.3 10*3/uL (ref 1.7–7.7)
Neutrophils Relative %: 60 %
Platelets: 309 10*3/uL (ref 150–400)
RBC: 5.39 MIL/uL (ref 4.22–5.81)
RDW: 13.3 % (ref 11.5–15.5)
WBC: 8.9 10*3/uL (ref 4.0–10.5)
nRBC: 0 % (ref 0.0–0.2)

## 2020-12-11 LAB — BASIC METABOLIC PANEL
Anion gap: 12 (ref 5–15)
BUN: 9 mg/dL (ref 6–20)
CO2: 26 mmol/L (ref 22–32)
Calcium: 9.3 mg/dL (ref 8.9–10.3)
Chloride: 104 mmol/L (ref 98–111)
Creatinine, Ser: 0.79 mg/dL (ref 0.61–1.24)
GFR, Estimated: >60 mL/min (ref >60)
Glucose, Bld: 108 mg/dL — ABNORMAL HIGH (ref 70–99)
Potassium: 3.7 mmol/L (ref 3.5–5.1)
Sodium: 142 mmol/L (ref 135–145)

## 2020-12-11 LAB — HEPARIN LEVEL (UNFRACTIONATED): Heparin Unfractionated: 0.45 IU/mL (ref 0.30–0.70)

## 2020-12-11 LAB — HEMOGLOBIN A1C
Hgb A1c MFr Bld: 8 % — ABNORMAL HIGH (ref 4.8–5.6)
Mean Plasma Glucose: 182.9 mg/dL

## 2020-12-11 LAB — GLUCOSE, CAPILLARY: Glucose-Capillary: 122 mg/dL — ABNORMAL HIGH (ref 70–99)

## 2020-12-11 LAB — PROTIME-INR
INR: 1 (ref 0.8–1.2)
Prothrombin Time: 12.8 seconds (ref 11.4–15.2)

## 2020-12-11 LAB — APTT: aPTT: 27 seconds (ref 24–36)

## 2020-12-11 MED ORDER — INSULIN ASPART 100 UNIT/ML ~~LOC~~ SOLN: 0.0000 [IU] | Freq: Every day | SUBCUTANEOUS | Status: DC

## 2020-12-11 MED ORDER — ASPIRIN EC 81 MG PO TBEC
81.0000 mg | DELAYED_RELEASE_TABLET | Freq: Every day | ORAL | Status: DC
Start: 2020-12-11 — End: 2020-12-12
  Administered 2020-12-11 – 2020-12-12 (×2): 81 mg via ORAL
  Filled 2020-12-11 (×2): qty 1

## 2020-12-11 MED ORDER — IOHEXOL 350 MG/ML SOLN
100.0000 mL | Freq: Once | INTRAVENOUS | Status: AC | PRN
  Administered 2020-12-11: 100 mL via INTRAVENOUS

## 2020-12-11 MED ORDER — PANTOPRAZOLE SODIUM 40 MG PO TBEC
40.0000 mg | DELAYED_RELEASE_TABLET | Freq: Every day | ORAL | Status: DC
  Administered 2020-12-11 – 2020-12-12 (×2): 40 mg via ORAL
  Filled 2020-12-11 (×2): qty 1

## 2020-12-11 MED ORDER — MORPHINE SULFATE (PF) 4 MG/ML IV SOLN: 4.0000 mg | Freq: Once | INTRAVENOUS | Status: DC

## 2020-12-11 MED ORDER — SODIUM CHLORIDE 0.9 % IV SOLN: INTRAVENOUS | Status: DC

## 2020-12-11 MED ORDER — ONDANSETRON HCL 4 MG/2ML IJ SOLN: 4.0000 mg | Freq: Once | INTRAMUSCULAR | Status: DC

## 2020-12-11 MED ORDER — INSULIN ASPART 100 UNIT/ML ~~LOC~~ SOLN
4.0000 [IU] | Freq: Three times a day (TID) | SUBCUTANEOUS | Status: DC
  Administered 2020-12-12 (×2): 4 [IU] via SUBCUTANEOUS

## 2020-12-11 MED ORDER — ALUM & MAG HYDROXIDE-SIMETH 200-200-20 MG/5ML PO SUSP: 15.0000 mL | ORAL | Status: DC | PRN

## 2020-12-11 MED ORDER — ONDANSETRON HCL 4 MG/2ML IJ SOLN: 4.0000 mg | Freq: Four times a day (QID) | INTRAMUSCULAR | Status: DC | PRN

## 2020-12-11 MED ORDER — OXYCODONE HCL 5 MG PO TABS: 5.0000 mg | ORAL_TABLET | ORAL | Status: DC | PRN

## 2020-12-11 MED ORDER — ATORVASTATIN CALCIUM 40 MG PO TABS
40.0000 mg | ORAL_TABLET | Freq: Every day | ORAL | Status: DC
  Administered 2020-12-11: 40 mg via ORAL
  Filled 2020-12-11: qty 1

## 2020-12-11 MED ORDER — GUAIFENESIN-DM 100-10 MG/5ML PO SYRP: 15.0000 mL | ORAL_SOLUTION | ORAL | Status: DC | PRN

## 2020-12-11 MED ORDER — MORPHINE SULFATE (PF) 2 MG/ML IV SOLN: 2.0000 mg | INTRAVENOUS | Status: DC | PRN

## 2020-12-11 MED ORDER — PHENOL 1.4 % MT LIQD: 1.0000 | OROMUCOSAL | Status: DC | PRN

## 2020-12-11 MED ORDER — HEPARIN (PORCINE) 25000 UT/250ML-% IV SOLN
1500.0000 [IU]/h | INTRAVENOUS | Status: DC
  Administered 2020-12-11 – 2020-12-12 (×2): 1500 [IU]/h via INTRAVENOUS
  Filled 2020-12-11 (×2): qty 250

## 2020-12-11 MED ORDER — HEPARIN BOLUS VIA INFUSION
5000.0000 [IU] | Freq: Once | INTRAVENOUS | Status: AC
  Administered 2020-12-11: 5000 [IU] via INTRAVENOUS

## 2020-12-11 MED ORDER — LABETALOL HCL 5 MG/ML IV SOLN: 10.0000 mg | INTRAVENOUS | Status: DC | PRN

## 2020-12-11 MED ORDER — INSULIN ASPART 100 UNIT/ML ~~LOC~~ SOLN
0.0000 [IU] | Freq: Three times a day (TID) | SUBCUTANEOUS | Status: DC
  Administered 2020-12-12 (×2): 2 [IU] via SUBCUTANEOUS

## 2020-12-11 MED ORDER — POTASSIUM CHLORIDE CRYS ER 20 MEQ PO TBCR
20.0000 meq | EXTENDED_RELEASE_TABLET | Freq: Once | ORAL | Status: AC
  Administered 2020-12-11: 20 meq via ORAL
  Filled 2020-12-11: qty 1

## 2020-12-11 MED ORDER — METOPROLOL TARTRATE 5 MG/5ML IV SOLN
2.0000 mg | INTRAVENOUS | Status: DC | PRN
Start: 2020-12-11 — End: 2020-12-12

## 2020-12-11 MED ORDER — HYDRALAZINE HCL 20 MG/ML IJ SOLN: 5.0000 mg | INTRAMUSCULAR | Status: DC | PRN

## 2020-12-11 NOTE — ED Triage Notes (Signed)
Pt c/o of right calf swelling and warm to touch x 1 week. Denies any injuries or redness.

## 2020-12-11 NOTE — Progress Notes (Signed)
ANTICOAGULATION CONSULT NOTE   Pharmacy Consult for Heparin  Indication: DVT  No Known Allergies  Patient Measurements: Height: 6\' 1"  (185.4 cm) Weight: 101.6 kg (224 lb) IBW/kg (Calculated) : 79.9 Heparin Dosing Weight: 100 kg  Vital Signs: Temp: 98.5 F (36.9 C) (02/28 2328) Temp Source: Oral (02/28 2328) BP: 143/78 (02/28 2328) Pulse Rate: 72 (02/28 2328)  Labs: Recent Labs    12/11/20 1529 12/11/20 1550 12/11/20 2239  HGB 15.0  --   --   HCT 46.4  --   --   PLT 309  --   --   APTT  --  27  --   LABPROT  --  12.8  --   INR  --  1.0  --   HEPARINUNFRC  --   --  0.45  CREATININE 0.79  --   --     Estimated Creatinine Clearance: 127.7 mL/min (by C-G formula based on SCr of 0.79 mg/dL).   Medical History: Past Medical History:  Diagnosis Date  . Blood in stool    long ago- polyps removed per pt- > 15 yrs ago   . Diabetes mellitus without complication (HCC)    borderline- on metformin  . Hyperlipidemia     Medications:  Medications Prior to Admission  Medication Sig Dispense Refill Last Dose  . Multiple Vitamin (MULTIVITAMIN WITH MINERALS) TABS tablet Take 1 tablet by mouth daily.   12/11/2020 at Unknown time    Assessment: Pharmacy consulted to dose heparin in patient with DVT.  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  Patient is not on anticoagulation prior to admission.  CBC WNL  2/28 PM update:  Heparin level therapeutic   Goal of Therapy:  Heparin level 0.3-0.7 units/ml Monitor platelets by anticoagulation protocol: Yes   Plan:  Cont heparin at 1500 units/hr Confirmatory heparin level with AM labs  [name], PharmD, [place] Pharmacist Phone: [phone]

## 2020-12-11 NOTE — ED Provider Notes (Signed)
[facility] EMERGENCY DEPARTMENT Provider Note   CSN: [id number] Arrival date & time: 12/11/20  1335    History Chief Complaint  Patient presents with  . Leg Pain    Cameron James is a 60 y.o. male with past medical history significant for diabetes, hyperlipidemia who presents for evaluation of right leg pain.  Began 1 week ago.  Has noted some swelling, pain to his right lower extremity.  States he thought had resolved a few days ago and then increase in pain and swelling.  He rates his current pain a 5/10.  Does not have PCP follow-up.  Patient denies any chest pain, shortness of breath, hemoptysis.  No prior history of PE, DVT, clotting disorders.  No recent surgery, immobilization, malignancy.  No fever, chills, nausea, vomiting, chest pain, shortness breath abdominal pain, diarrhea, dysuria no paresthesias or weakness. No anticoagulation. Denies additional aggravating or alleviating factors.  History pain from patient and past medical records.  No interpreter used  Last PO intake 10 AM today  HPI     Past Medical History:  Diagnosis Date  . Blood in stool    long ago- polyps removed per pt- > 15 yrs ago   . Diabetes mellitus without complication (HCC)    borderline- on metformin  . Hyperlipidemia     Patient Active Problem List   Diagnosis Date Noted  . Type 2 diabetes mellitus without complication, without long-term current use of insulin ([place]) 05/21/2017  . Hyperlipidemia LDL goal <100 05/21/2017  . Class 1 obesity due to excess calories with serious comorbidity and body mass index (BMI) of 32.0 to 32.9 in adult 05/21/2017    Past Surgical History:  Procedure Laterality Date  . POLYPECTOMY     in [place] [facility]- pt unsure when > 15 yrs ago        Family History  Problem Relation Age of Onset  . Diabetes Mother   . Hypertension Mother   . Heart disease Father   . Colon cancer Neg Hx   . Colon polyps Neg Hx   . Esophageal cancer Neg Hx   . Rectal cancer Neg  Hx   . Stomach cancer Neg Hx     Social History   Tobacco Use  . Smoking status: Current Every Day Smoker    Packs/day: 0.10    Types: Cigars  . Smokeless tobacco: Never Used  Vaping Use  . Vaping Use: Never used  Substance Use Topics  . Alcohol use: Yes    Alcohol/week: 2.0 standard drinks    Types: 1 Cans of beer, 1 Shots of liquor per week  . Drug use: No    Home Medications Prior to Admission medications   Medication Sig Start Date End Date Taking? Authorizing Provider  aspirin EC 81 MG tablet Take 1 tablet (81 mg total) by mouth daily. 09/09/18   Shawnee Knapp, MD  atorvastatin (LIPITOR) 40 MG tablet TAKE 1 TABLET BY MOUTH EVERY DAY 05/09/20   Maximiano Coss, NP  blood glucose meter kit and supplies KIT One touch ultra. Use up to four times daily as directed. (FOR ICD-10 E11.9). 03/04/17   Shawnee Knapp, MD  Continuous Blood Gluc Receiver (FREESTYLE LIBRE 14 DAY READER) DEVI 1 Units by Does not apply route 6 (six) times daily. Due to hyperglycemia, med changes, diet adjustments Dx: Y50.35 03/24/18   Shawnee Knapp, MD  Continuous Blood Gluc Sensor (FREESTYLE LIBRE 14 DAY SENSOR) MISC 1 Units by Does not apply route 6 (  six) times daily. Due to hyperglycemia, med changes, diet adjustments Dx: F02.63 03/24/18   Shawnee Knapp, MD  Dapagliflozin-metFORMIN HCl ER (XIGDUO XR) 02-999 MG TB24 Take 1 tablet by mouth daily. 04/25/20   Maximiano Coss, NP  Vitamin D, Ergocalciferol, (DRISDOL) 50000 units CAPS capsule  03/19/18   [provider]    Allergies    Patient has no known allergies.  Review of Systems   Review of Systems  Constitutional: Negative.   HENT: Negative.   Respiratory: Negative.   Cardiovascular: Negative.   Gastrointestinal: Negative.   Genitourinary: Negative.   Musculoskeletal:       Right lower extremity pain  Skin:       Redness to RLE  Neurological: Negative.   Hematological: Negative.   All other systems reviewed and are negative.  Physical  Exam Updated Vital Signs BP (!) 164/90 (BP Location: Right Arm)   Pulse 61   Temp 98.6 F (37 C) (Oral)   Resp (!) 23   Ht $R'6\' 1"'VE$  (1.854 m)   Wt 101.6 kg   SpO2 100%   BMI 29.55 kg/m   Physical Exam Vitals and nursing note reviewed.  Constitutional:      General: He is not in acute distress.    Appearance: He is well-developed and well-nourished. He is not ill-appearing, toxic-appearing or diaphoretic.  HENT:     Head: Normocephalic and atraumatic.     Nose: Nose normal.     Mouth/Throat:     Mouth: Mucous membranes are moist.  Eyes:     Pupils: Pupils are equal, round, and reactive to light.  Cardiovascular:     Rate and Rhythm: Normal rate and regular rhythm.     Pulses: Normal pulses.          Radial pulses are 2+ on the right side and 2+ on the left side.       Dorsalis pedis pulses are 2+ on the right side and 2+ on the left side.       Posterior tibial pulses are 2+ on the right side and 2+ on the left side.     Heart sounds: Normal heart sounds.  Pulmonary:     Effort: Pulmonary effort is normal. No respiratory distress.     Breath sounds: Normal breath sounds.  Abdominal:     General: Bowel sounds are normal. There is no distension.     Palpations: Abdomen is soft.  Musculoskeletal:        General: Swelling and tenderness present. Normal range of motion.     Cervical back: Normal range of motion and neck supple.     Comments: Diffuse tenderness to right calf, mid thigh.  Some overlying erythema.  Some edema to right lower extremity however nonpitting.  Skin:    General: Skin is warm and dry.     Capillary Refill: Capillary refill takes less than 2 seconds.  Neurological:     General: No focal deficit present.     Mental Status: He is alert and oriented to person, place, and time.     Comments: Intact sensation Ambulatory without difficulty  Psychiatric:        Mood and Affect: Mood and affect normal.   ED Results / Procedures / Treatments   Labs (all labs  ordered are listed, but only abnormal results are displayed) Labs Reviewed  BASIC METABOLIC PANEL - Abnormal; Notable for the following components:      Result Value   Glucose, Bld 108 (*)  All other components within normal limits  SARS CORONAVIRUS 2 (TAT 6-24 HRS)  CBC WITH DIFFERENTIAL/PLATELET  PROTIME-INR  APTT  HEPARIN LEVEL (UNFRACTIONATED)  HEPARIN LEVEL (UNFRACTIONATED)  CBC    EKG None  Radiology US Venous Img Lower Unilateral Right  Result Date: 12/11/2020 CLINICAL DATA:  Right lower extremity edema EXAM: RIGHT LOWER EXTREMITY VENOUS DOPPLER ULTRASOUND TECHNIQUE: Gray-scale sonography with compression, as well as color and duplex ultrasound, were performed to evaluate the deep venous system(s) from the level of the common femoral vein through the popliteal and proximal calf veins. COMPARISON:  None. FINDINGS: VENOUS Sonographic evaluation of the venous structures of the right lower extremity demonstrate occlusive thrombus extending through from the right common femoral vein through the femoral vein into the popliteal vein. The common femoral, femoral, and popliteal veins are noncompressible, with no color flow identified. Thrombus extends into the posterior tibial veins. The peroneal veins are not well visualized. The greater saphenous and profundus femoral vein are widely patent. Limited views of the contralateral common femoral vein are unremarkable. OTHER None. Limitations: none IMPRESSION: 1. Extensive deep venous thrombosis throughout the right lower extremity, with occlusive thrombus throughout the common femoral, femoral, and popliteal veins as above. Electronically Signed   By: Randa Ngo M.D.   On: 12/11/2020 15:17    Procedures .Critical Care Performed by: Nettie Elm, PA-C Authorized by: Nettie Elm, PA-C   Critical care provider statement:    Critical care time (minutes):  45   Critical care was necessary to treat or prevent imminent or  life-threatening deterioration of the following conditions:  Circulatory failure   Critical care was time spent personally by me on the following activities:  Discussions with consultants, evaluation of patient's response to treatment, examination of patient, ordering and performing treatments and interventions, ordering and review of laboratory studies, ordering and review of radiographic studies, pulse oximetry, re-evaluation of patient's condition, obtaining history from patient or surrogate and review of old charts     Medications Ordered in ED Medications  ondansetron (ZOFRAN) injection 4 mg (4 mg Intravenous Patient Refused/Not Given 12/11/20 1724)  morphine 4 MG/ML injection 4 mg (4 mg Intravenous Patient Refused/Not Given 12/11/20 1724)  heparin ADULT infusion 100 units/mL (25000 units/238mL) (1,500 Units/hr Intravenous Rate/Dose Verify 12/11/20 1727)  heparin bolus via infusion 5,000 Units (5,000 Units Intravenous Bolus from Bag 12/11/20 1642)    ED Course  I have reviewed the triage vital signs and the nursing notes.  Pertinent labs & imaging results that were available during my care of the patient were reviewed by me and considered in my medical decision making (see chart for details).  Patient here with 1 week of unilateral leg swelling, redness and warmth.  No risk factors for PE or DVT.  He is afebrile, nonseptic, non-ill-appearing.  No chest pain or shortness of breath.  Without tachycardia, tachypnea or hypoxia.  Work-up started from triage.   [place] RLE with extensive occlusive thrombosis from common femoral through the tibial vein. NV intact on exam. Tactile temp to extremities. No phlegmasia cerulea dolens  CONSULT with Dr. Donzetta Matters with vascular surgery.  Recommends ED to ED transport for evaluation. Repage call when gets to ED  CBC without leukocytosis Hgb 15.0 BMP glucose 108, no additional electrolyte, renal or liver normality INR 1.0 PTT 27  Discussed results with patient.   Agreeable for transport. Hemodynamically stable for transport. Low suspicion for PE given lack of CP, SOB.  His abdomen is soft, nontender.  Started  on heparin for extensive DVT.  Will send patient ED to ED to [facility] for vascular surgery consult.   Dr. Tyrone Nine EDP agrees to accept patient in transfer  Patient seen eval by attending, Dr. Vanita Panda who agrees with above treatment, plan and disposition.    MDM Rules/Calculators/A&P                           Final Clinical Impression(s) / ED Diagnoses Final diagnoses:  Acute deep vein thrombosis (DVT) of femoral vein of right lower extremity (HCC)  Acute deep vein thrombosis (DVT) of right tibial vein [facility])    Rx / DC Orders ED Discharge Orders    None       ,  A, PA-C 12/11/20 1746    Carmin Muskrat, MD 12/12/20 [phone]

## 2020-12-11 NOTE — ED Provider Notes (Signed)
60 yo M with a chief complaints of right leg pain and swelling.  Found to have an extensive DVT was sent here for vascular evaluation from [facility].  On arrival patient has a swollen right lower extremity intact pulse motor and sensation.  Patient was seen by vascular and they plan to admit him to the hospital.   Deno Etienne, DO 12/11/20 1946

## 2020-12-11 NOTE — H&P (Addendum)
Hospital Consult    Reason for Consult:  RLE DVT Referring Physician:  Dr. Tyrone Nine MRN #:  [id number]  History of Present Illness: This is a 60 y.o. male without significant history of blood clot presents today with 1 week history of right lower extremity swelling and pain. He denies any recent injury. He has been started on heparin at [facility] states that both swelling and pain have mostly resolved while he is lying still. Denies any previous history of malignancy. No recent surgery no recent immobilization. 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 Did have a colonoscopy in the past with 1 polyp. No recent changes in diet or bowel habits. Does have approximately 6 to 7 pound unintentional weight loss over the past couple weeks. He is a long-term smoker. He has not had COVID and has not been vaccinated.   Past Medical History:  Diagnosis Date  . Blood in stool    long ago- polyps removed per pt- > 15 yrs ago   . Diabetes mellitus without complication (HCC)    borderline- on metformin  . Hyperlipidemia     Past Surgical History:  Procedure Laterality Date  . POLYPECTOMY     in [place] [facility]- pt unsure when > 15 yrs ago     No Known Allergies  Prior to Admission medications   Medication Sig Start Date End Date Taking? Authorizing Provider  aspirin EC 81 MG tablet Take 1 tablet (81 mg total) by mouth daily. 09/09/18   Shawnee Knapp, MD  atorvastatin (LIPITOR) 40 MG tablet TAKE 1 TABLET BY MOUTH EVERY DAY 05/09/20   Maximiano Coss, NP  blood glucose meter kit and supplies KIT One touch ultra. Use up to four times daily as directed. (FOR ICD-10 E11.9). 03/04/17   Shawnee Knapp, MD  Continuous Blood Gluc Receiver (FREESTYLE LIBRE 14 DAY READER) DEVI 1 Units by Does not apply route 6 (six) times daily. Due to hyperglycemia, med changes, diet adjustments Dx: E11.42 03/24/18   Shawnee Knapp, MD  Continuous Blood  Gluc Sensor (FREESTYLE LIBRE 14 DAY SENSOR) MISC 1 Units by Does not apply route 6 (six) times daily. Due to hyperglycemia, med changes, diet adjustments Dx: K53.97 03/24/18   Shawnee Knapp, MD  Dapagliflozin-metFORMIN HCl ER (XIGDUO XR) 02-999 MG TB24 Take 1 tablet by mouth daily. 04/25/20   Maximiano Coss, NP  Vitamin D, Ergocalciferol, (DRISDOL) 50000 units CAPS capsule  03/19/18   [provider]    Social History   Socioeconomic History  . Marital status: Single    Spouse name: Not on file  . Number of children: 1  . Years of education: Not on file  . Highest education level: Not on file  Occupational History  . Not on file  Tobacco Use  . Smoking status: Current Every Day Smoker    Packs/day: 0.10    Types: Cigars  . Smokeless tobacco: Never Used  Vaping Use  . Vaping Use: Never used  Substance and Sexual Activity  . Alcohol use: Yes    Alcohol/week: 2.0 standard drinks    Types: 1 Cans of beer, 1 Shots of liquor per week  . Drug use: No  . Sexual activity: Not Currently  Other Topics Concern  . Not on file  Social History Narrative  . Not on file   Social Determinants of Health   Financial Resource Strain: Not  on file  Food Insecurity: Not on file  Transportation Needs: Not on file  Physical Activity: Not on file  Stress: Not on file  Social Connections: Not on file  Intimate Partner Violence: Not on file    Family History  Problem Relation Age of Onset  . Diabetes Mother   . Hypertension Mother   . Heart disease Father   . Colon cancer Neg Hx   . Colon polyps Neg Hx   . Esophageal cancer Neg Hx   . Rectal cancer Neg Hx   . Stomach cancer Neg Hx     ROS:  Cardiovascular: _0  chest pain/pressure _1  palpitations _2  SOB lying flat _3  DOE _4  pain in legs while walking _5  pain in legs at rest _6  pain in legs at night _7  non-healing ulcers _8  hx of DVT _9  swelling in legs  Pulmonary: _10  productive cough _11  asthma/wheezing _12  home  O2  Neurologic: _13  weakness in _14  arms _15  legs _16  numbness in _17  arms _18  legs _19  hx of CVA _20  mini stroke _21 difficulty speaking or slurred speech _22  temporary loss of vision in one eye _23  dizziness  Hematologic: _24  hx of cancer _25  bleeding problems _26  problems with blood clotting easily  Endocrine:   _27  diabetes _28  thyroid disease  GI _29  vomiting blood _30  blood in stool  GU: _31  CKD/renal failure _32  HD--_33  M/W/F or _34  T/T/S _35  burning with urination _36  blood in urine  Psychiatric: _37  anxiety _38  depression  Musculoskeletal: _39  arthritis _40  joint pain  Integumentary: _41  rashes _42  ulcers  Constitutional: _43  fever _44  chills   Physical Examination  Vitals:   12/11/20 1645 12/11/20 1715  BP: (!) 161/92 (!) 164/90  Pulse: 92 61  Resp: 19 (!) 23  Temp:    SpO2: 98% 100%   Body mass index is 29.55 kg/m.  General:  nad HENT: WNL, normocephalic Pulmonary: normal non-labored breathing Cardiac: palpable pulse Abdomen: soft, NT/ND, no masses Musculoskeletal: right leg approx 10% larger than left Neurologic: A&O X 3; Appropriate Affect ; SENSATION: normal; MOTOR FUNCTION:  moving all extremities equally. Speech is fluent/normal  CBC    Component Value Date/Time   WBC 8.9 12/11/2020 1529   RBC 5.39 12/11/2020 1529   HGB 15.0 12/11/2020 1529   HGB 16.6 04/25/2020 1211   HCT 46.4 12/11/2020 1529   HCT 49.2 04/25/2020 1211   PLT 309 12/11/2020 1529   PLT 216 04/25/2020 1211   MCV 86.1 12/11/2020 1529   MCV 88 04/25/2020 1211   MCH 27.8 12/11/2020 1529   MCHC 32.3 12/11/2020 1529   RDW 13.3 12/11/2020 1529   RDW 13.0 04/25/2020 1211   LYMPHSABS 2.7 12/11/2020 1529   LYMPHSABS 2.3 04/25/2020 1211   MONOABS 0.7 12/11/2020 1529   EOSABS 0.2 12/11/2020 1529   EOSABS 0.1 04/25/2020 1211   BASOSABS 0.0 12/11/2020 1529   BASOSABS 0.0 04/25/2020 1211    BMET    Component Value Date/Time   NA 142 12/11/2020 1529   NA 143 04/25/2020 1211   K 3.7  12/11/2020 1529   CL 104 12/11/2020 1529   CO2 26 12/11/2020 1529   GLUCOSE 108 (H) 12/11/2020 1529   BUN 9 12/11/2020 1529   BUN 15 04/25/2020 1211   CREATININE 0.79 12/11/2020 1529   CALCIUM 9.3 12/11/2020 1529   GFRNONAA >60 12/11/2020 1529   GFRAA 102 04/25/2020 1211    COAGS: Lab Results  Component Value Date   INR 1.0 12/11/2020     Non-Invasive Vascular  Imaging:   Venous duplex IMPRESSION:  Extensive deep venous thrombosis throughout the right lower extremity, with occlusive thrombus throughout the common femoral, femoral, and popliteal veins as above.    ASSESSMENT/PLAN: This is a 60 y.o. male with extensive right lower extremity DVT risk factors include smoking. Given that this is right-sided without any real underlying cause we will obtain CT venogram. I discussed with him the possible outcomes of the CT scan including possible need for endovascular intervention tomorrow. We will make him n.p.o. past midnight plan for admission on heparin drip tonight.  Gatsby Chismar C. Donzetta Matters, MD [facility] Office: [phone] Pager: [phone]  Addendum: CT venogram reviewed.  As long as he has tolerable swelling tomorrow we will plan for him with compression stockings and discharged him on anticoagulation with short interval follow-up in 3 to 4 weeks for further evaluation of his right lower extremity.  Anaelle Dunton C. Donzetta Matters

## 2020-12-11 NOTE — Progress Notes (Signed)
ANTICOAGULATION CONSULT NOTE - Initial Consult  Pharmacy Consult for heparin  Indication: DVT  No Known Allergies  Patient Measurements: Height: 6\' 1"  (185.4 cm) Weight: 101.6 kg (224 lb) IBW/kg (Calculated) : 79.9 Heparin Dosing Weight: 100 kg  Vital Signs: Temp: 98.6 F (37 C) (02/28 1408) Temp Source: Oral (02/28 1408) BP: 156/85 (02/28 1408) Pulse Rate: 90 (02/28 1408)  Labs: Recent Labs    12/11/20 1529 12/11/20 1550  HGB 15.0  --   HCT 46.4  --   PLT 309  --   APTT  --  27  LABPROT  --  12.8  INR  --  1.0    CrCl cannot be calculated (Patient's most recent lab result is older than the maximum 21 days allowed.).   Medical History: Past Medical History:  Diagnosis Date  . Blood in stool    long ago- polyps removed per pt- > 15 yrs ago   . Diabetes mellitus without complication (HCC)    borderline- on metformin  . Hyperlipidemia     Medications:  (Not in a hospital admission)   Assessment: Pharmacy consulted to dose heparin in patient with DVT.  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  Patient is not on anticoagulation prior to admission.  CBC WNL  Goal of Therapy:  Heparin level 0.3-0.7 units/ml Monitor platelets by anticoagulation protocol: Yes   Plan:  Give 5000 units bolus x 1  Start heparin infusion at 1500 units/hr Heparin level in 6 hours and daily. Continue to monitor H&H and platelets.  Margot Ables, PharmD Clinical Pharmacist 12/11/2020 4:23 PM

## 2020-12-11 NOTE — ED Notes (Addendum)
Entered room and introduced self to patient. Pt appears to be resting in bed, respirations are even and unlabored with equal chest rise and fall. Bed is locked in the lowest position, side rails x2, call bell within reach. Pt educated on call light use and hourly rounding, verbalized understanding and in agreement at this time. All questions and concerns voiced addressed. Refreshments offered and provided per patient request.  

## 2020-12-12 ENCOUNTER — Other Ambulatory Visit: Payer: Self-pay | Admitting: Physician Assistant

## 2020-12-12 DIAGNOSIS — I82401 Acute embolism and thrombosis of unspecified deep veins of right lower extremity: Secondary | ICD-10-CM | POA: Diagnosis not present

## 2020-12-12 LAB — CBC
HCT: 42.6 % (ref 39.0–52.0)
Hemoglobin: 14.1 g/dL (ref 13.0–17.0)
MCH: 27.9 pg (ref 26.0–34.0)
MCHC: 33.1 g/dL (ref 30.0–36.0)
MCV: 84.2 fL (ref 80.0–100.0)
Platelets: 268 10*3/uL (ref 150–400)
RBC: 5.06 MIL/uL (ref 4.22–5.81)
RDW: 13.4 % (ref 11.5–15.5)
WBC: 8.9 10*3/uL (ref 4.0–10.5)
nRBC: 0 % (ref 0.0–0.2)

## 2020-12-12 LAB — BASIC METABOLIC PANEL
Anion gap: 10 (ref 5–15)
BUN: 7 mg/dL (ref 6–20)
CO2: 25 mmol/L (ref 22–32)
Calcium: 9 mg/dL (ref 8.9–10.3)
Chloride: 106 mmol/L (ref 98–111)
Creatinine, Ser: 0.86 mg/dL (ref 0.61–1.24)
GFR, Estimated: >60 mL/min (ref >60)
Glucose, Bld: 117 mg/dL — ABNORMAL HIGH (ref 70–99)
Potassium: 3.7 mmol/L (ref 3.5–5.1)
Sodium: 141 mmol/L (ref 135–145)

## 2020-12-12 LAB — HEPARIN LEVEL (UNFRACTIONATED): Heparin Unfractionated: 0.39 IU/mL (ref 0.30–0.70)

## 2020-12-12 LAB — HIV ANTIBODY (ROUTINE TESTING W REFLEX): HIV Screen 4th Generation wRfx: NONREACTIVE

## 2020-12-12 LAB — SARS CORONAVIRUS 2 (TAT 6-24 HRS): SARS Coronavirus 2: NEGATIVE

## 2020-12-12 LAB — GLUCOSE, CAPILLARY
Glucose-Capillary: 138 mg/dL — ABNORMAL HIGH (ref 70–99)
Glucose-Capillary: 121 mg/dL — ABNORMAL HIGH (ref 70–99)

## 2020-12-12 MED ORDER — ASPIRIN 81 MG PO TBEC: 81.0000 mg | DELAYED_RELEASE_TABLET | Freq: Every day | ORAL | 11 refills | Status: AC

## 2020-12-12 MED ORDER — ATORVASTATIN CALCIUM 40 MG PO TABS: 40.0000 mg | ORAL_TABLET | Freq: Every day | ORAL | 1 refills | Status: DC

## 2020-12-12 MED ORDER — APIXABAN 5 MG PO TABS
10.0000 mg | ORAL_TABLET | Freq: Two times a day (BID) | ORAL | Status: DC
  Administered 2020-12-12: 10 mg via ORAL
  Filled 2020-12-12: qty 2

## 2020-12-12 MED ORDER — APIXABAN 5 MG PO TABS: 5.0000 mg | ORAL_TABLET | Freq: Two times a day (BID) | ORAL | Status: DC

## 2020-12-12 MED ORDER — APIXABAN (ELIQUIS) VTE STARTER PACK (10MG AND 5MG): ORAL_TABLET | ORAL | 0 refills | Status: DC

## 2020-12-12 MED FILL — ELIQUIS STARTER PACK 5 MG T: 5 | 30 days supply | Qty: 74 | Fill #0

## 2020-12-12 MED FILL — ATORVASTATIN CALCIUM 40 MG: 40 | 30 days supply | Qty: 30 | Fill #0

## 2020-12-12 NOTE — Discharge Instructions (Signed)
Deep Vein Thrombosis  Deep vein thrombosis (DVT) is a condition in which a blood clot forms in a deep vein, such as a vein in the lower leg, thigh, pelvis, or arm. Deep veins are veins in the deep venous system. A clot is blood that has thickened into a gel or solid. This condition is serious and can be life-threatening if the clot travels to the lungs and causes a blockage (pulmonary embolism) in the arteries of the lung. A DVT can also damage veins in the leg. This can lead to long-term, or chronic, venous disease, leg pain, swelling, discoloration, and ulcers or sores (post-thrombotic syndrome). What are the causes? This condition may be caused by:  A slowdown of blood flow.  Damage to a vein.  A condition that causes blood to clot more easily, such as certain blood-clotting disorders. What increases the risk? The following factors may make you more likely to develop this condition:  Having obesity.  Being older, especially older than age 60.  Being inactive (sedentary lifestyle) or not moving around. This may include: ? Sitting or lying down for longer than 4-6 hours other than to sleep at night. ? Being in the hospital, having major or lengthy surgery, or having a thin, flexible tube (central line catheter) placed in a large vein.  Being pregnant, giving birth, or having recently given birth.  Taking medicines that contain estrogen, such as birth control or hormone replacement therapy.  Using products that contain nicotine or tobacco, especially if you use hormonal birth control.  Having a history of blood clots or a blood-clotting disease, a blood vessel disease (peripheral vascular disease), or congestive heart disease.  Having a history of cancer, especially if being treated with chemotherapy. What are the signs or symptoms? Symptoms of this condition include:  Swelling, pain, pressure, or tenderness in an arm or a leg.  An arm or a leg becoming warm, red, or  discolored.  A leg turning very pale. You may have a large DVT. This is rare. If the clot is in your leg, you may notice symptoms more or have worse symptoms when you stand or walk. In some cases, there are no symptoms. How is this diagnosed? This condition is diagnosed with:  Your medical history and a physical exam.  Tests, such as: ? Blood tests to check how well your blood clots. ? Doppler ultrasound. This is the best way to find a DVT. ? Venogram. Contrast dye is injected into a vein, and X-rays are taken to check for clots. How is this treated? Treatment for this condition depends on:  The cause of your DVT.  The size and location of your DVT, or having more than one DVT.  Your risk for bleeding or developing more clots.  Other medical conditions you may have. Treatment may include:  Taking a blood thinner, also called an anticoagulant, to prevent clots from forming and growing.  Wearing compression stockings, if directed.  Injecting medicines into the affected vein to break up the clot (catheter-directed thrombolysis). This is used only for severe DVT and only if a specialist recommends it.  Specific surgical procedures, when DVT is severe or hard to treat. These may be done to: ? Isolate and remove your clot. ? Place an inferior vena cava (IVC) filter in a large vein to catch blood clots before they reach your lungs. You may get some medical treatments for 6 months or longer. Follow these instructions at home: If you are taking blood thinners:    Talk with your health care provider before you take any medicines that contain aspirin or NSAIDs, such as ibuprofen. These medicines increase your risk for dangerous bleeding.  Take your medicine exactly as told, at the same time every day. Do not skip a dose. Do not take more than the prescribed dose. This is important.  Ask your health care provider about foods and medicines that could change the way your blood thinner  works (may interact). Avoid these foods and medicines if you are told to do so.  Avoid anything that may cause bleeding or bruising. You may bleed more easily while taking blood thinners. ? Be very careful when using knives, scissors, or other sharp objects. ? Use an electric razor instead of a blade. ? Avoid activities that could cause injury or bruising, and follow instructions for preventing falls. ? Tell your health care provider if you have had any internal bleeding, bleeding ulcers, or neurologic diseases, such as strokes or cerebral aneurysms.  Wear a medical alert bracelet or carry a card that lists what medicines you take. General instructions  Take over-the-counter and prescription medicines only as told by your health care provider.  Return to your normal activities as told by your health care provider. Ask your health care provider what activities are safe for you.  If recommended, wear compression stockings as told by your health care provider. These stockings help to prevent blood clots and reduce swelling in your legs.  Keep all follow-up visits as told by your health care provider. This is important. Contact a health care provider if:  You miss a dose of your blood thinner.  You have new or worse pain, swelling, or redness in an arm or a leg.  You have worsening numbness or tingling in an arm or a leg.  You have unusual bruising. Get help right away if:  You have signs or symptoms that a blood clot has moved to the lungs. These may include: ? Shortness of breath. ? Chest pain. ? Fast or irregular heartbeats (palpitations). ? Light-headedness or dizziness. ? Coughing up blood.  You have signs or symptoms that your blood is too thin. These may include: ? Blood in your vomit, stool, or urine. ? A cut that will not stop bleeding. ? A menstrual period that is heavier than usual. ? A severe headache or confusion. These symptoms may represent a serious problem that  is an emergency. Do not wait to see if the symptoms will go away. Get medical help right away. Call your local emergency services (911 in the [place] Do not drive yourself to the hospital. Summary  Deep vein thrombosis (DVT) happens when a blood clot forms in a deep vein. This may occur in the lower leg, thigh, pelvis, or arm.  Symptoms affect the arm or leg and can include swelling, pain, tenderness, warmth, redness, or discoloration.  This condition may be treated with medicines or compression stockings. In severe cases, surgery may be done.  If you are taking blood thinners, take them exactly as told. Do not skip a dose. Do not take more than is prescribed.  Get help right away if you have shortness of breath, chest pain, fast or irregular heartbeats, or blood in your vomit, urine, or stool. This information is not intended to replace advice given to you by your health care provider. Make sure you discuss any questions you have with your health care provider. Document Revised: 09/25/2019 Document Reviewed: 09/25/2019 Elsevier Patient Education  2021 Elsevier   Inc.  

## 2020-12-12 NOTE — Progress Notes (Addendum)
  Progress Note    12/12/2020 7:40 AM  Subjective:  R leg feels better   Vitals:   12/11/20 2328 12/12/20 0417  BP: (!) 143/78 (!) 152/80  Pulse: 72 89  Resp: 20 19  Temp: 98.5 F (36.9 C) 97.9 F (36.6 C)  SpO2: 97% 95%   Physical Exam: Lungs:  Non labored Extremities:  Mild edema R compared to LLE Neurologic: A&O  CBC    Component Value Date/Time   WBC 8.9 12/12/2020 0145   RBC 5.06 12/12/2020 0145   HGB 14.1 12/12/2020 0145   HGB 16.6 04/25/2020 1211   HCT 42.6 12/12/2020 0145   HCT 49.2 04/25/2020 1211   PLT 268 12/12/2020 0145   PLT 216 04/25/2020 1211   MCV 84.2 12/12/2020 0145   MCV 88 04/25/2020 1211   MCH 27.9 12/12/2020 0145   MCHC 33.1 12/12/2020 0145   RDW 13.4 12/12/2020 0145   RDW 13.0 04/25/2020 1211   LYMPHSABS 2.7 12/11/2020 1529   LYMPHSABS 2.3 04/25/2020 1211   MONOABS 0.7 12/11/2020 1529   EOSABS 0.2 12/11/2020 1529   EOSABS 0.1 04/25/2020 1211   BASOSABS 0.0 12/11/2020 1529   BASOSABS 0.0 04/25/2020 1211    BMET    Component Value Date/Time   NA 141 12/12/2020 0145   NA 143 04/25/2020 1211   K 3.7 12/12/2020 0145   CL 106 12/12/2020 0145   CO2 25 12/12/2020 0145   GLUCOSE 117 (H) 12/12/2020 0145   BUN 7 12/12/2020 0145   BUN 15 04/25/2020 1211   CREATININE 0.86 12/12/2020 0145   CALCIUM 9.0 12/12/2020 0145   GFRNONAA >60 12/12/2020 0145   GFRAA 102 04/25/2020 1211    INR    Component Value Date/Time   INR 1.0 12/11/2020 1550     Intake/Output Summary (Last 24 hours) at 12/12/2020 0740 Last data filed at 12/11/2020 2200 Gross per 24 hour  Intake 77.86 ml  Output 1 ml  Net 76.86 ml     Assessment/Plan:  60 y.o. male with extensive RLE DVT   Subjectively RLE better this morning Patient will be fitted for thigh high compression TOC consulted to check affordability of DOAC; continue heparin Home likely this afternoon after the above   Dagoberto Ligas, PA-C [facility] [phone] 12/12/2020 7:40 AM  I have independently interviewed and examined patient and agree with PA assessment and plan above.  Swelling mostly resolved currently has compression socks on and pain is resolved as well.  Unknown etiology of DVT without recent Covid, no previous history of DVT and nothing on CT venogram to suggest obstruction.  He will follow-up in a few weeks to evaluate his leg, I have instructed him that if his symptoms acutely worsen I need to see him prior to that.  He should be okay for discharge when he is set up with DOAC.  Myishia Kasik C. Donzetta Matters, MD [facility] [place] Office: [phone] Pager: [phone]

## 2020-12-12 NOTE — Progress Notes (Signed)
ANTICOAGULATION CONSULT NOTE   Pharmacy Consult for Heparin  Indication: DVT  No Known Allergies  Patient Measurements: Height: 6\' 1"  (185.4 cm) Weight: 101.6 kg (224 lb) IBW/kg (Calculated) : 79.9 Heparin Dosing Weight: 100 kg  Vital Signs: Temp: 98 F (36.7 C) (03/01 0814) Temp Source: Oral (03/01 0814) BP: 137/87 (03/01 0814) Pulse Rate: 82 (03/01 0814)  Labs: Recent Labs    12/11/20 1529 12/11/20 1550 12/11/20 2239 12/12/20 0145  HGB 15.0  --   --  14.1  HCT 46.4  --   --  42.6  PLT 309  --   --  268  APTT  --  27  --   --   LABPROT  --  12.8  --   --   INR  --  1.0  --   --   HEPARINUNFRC  --   --  0.45 0.39  CREATININE 0.79  --   --  0.86    Estimated Creatinine Clearance: 118.8 mL/min (by C-G formula based on SCr of 0.86 mg/dL).   Medical History: Past Medical History:  Diagnosis Date   Blood in stool    long ago- polyps removed per pt- > 15 yrs ago    Diabetes mellitus without complication (HCC)    borderline- on metformin   Hyperlipidemia     Medications:  Medications Prior to Admission  Medication Sig Dispense Refill Last Dose   Multiple Vitamin (MULTIVITAMIN WITH MINERALS) TABS tablet Take 1 tablet by mouth daily.   12/11/2020 at Unknown time    Assessment: Pharmacy consulted to dose heparin in patient with DVT.  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  Patient is not on anticoagulation prior to admission. -heparin level at goal    Goal of Therapy:  Heparin level 0.3-0.7 units/ml Monitor platelets by anticoagulation protocol: Yes   Plan:  -Continue heparin at 1500 units/hr -Daily heparin level and CBC -Looking into cost of apixaban/xarelto  Hildred Laser, PharmD Clinical Pharmacist **Pharmacist phone directory can now be found on [email or web address] ([email or web address]).  Listed under [place].

## 2020-12-12 NOTE — Progress Notes (Signed)
ANTICOAGULATION CONSULT NOTE - Initial Consult  Pharmacy Consult for heparin >> apixaban Indication: DVT  No Known Allergies  Patient Measurements: Height: 6\' 1"  (185.4 cm) Weight: 101.6 kg (224 lb) IBW/kg (Calculated) : 79.9   Vital Signs: Temp: 98 F (36.7 C) (03/01 1125) Temp Source: Oral (03/01 1125) BP: 146/82 (03/01 1125) Pulse Rate: 79 (03/01 1125)  Labs: Recent Labs    12/11/20 1529 12/11/20 1550 12/11/20 2239 12/12/20 0145  HGB 15.0  --   --  14.1  HCT 46.4  --   --  42.6  PLT 309  --   --  268  APTT  --  27  --   --   LABPROT  --  12.8  --   --   INR  --  1.0  --   --   HEPARINUNFRC  --   --  0.45 0.39  CREATININE 0.79  --   --  0.86    Estimated Creatinine Clearance: 118.8 mL/min (by C-G formula based on SCr of 0.86 mg/dL).   Medical History: Past Medical History:  Diagnosis Date  . Blood in stool    long ago- polyps removed per pt- > 15 yrs ago   . Diabetes mellitus without complication (HCC)    borderline- on metformin  . Hyperlipidemia     Medications:  Medications Prior to Admission  Medication Sig Dispense Refill Last Dose  . Multiple Vitamin (MULTIVITAMIN WITH MINERALS) TABS tablet Take 1 tablet by mouth daily.   12/11/2020 at Unknown time   Scheduled:  . apixaban  10 mg Oral BID   Followed by  . [START ON 12/19/2020] apixaban  5 mg Oral BID  . aspirin EC  81 mg Oral Daily  . atorvastatin  40 mg Oral q1800  . insulin aspart  0-15 Units Subcutaneous TID WC  . insulin aspart  0-5 Units Subcutaneous QHS  . insulin aspart  4 Units Subcutaneous TID WC  .  morphine injection  4 mg Intravenous Once  . ondansetron (ZOFRAN) IV  4 mg Intravenous Once  . pantoprazole  40 mg Oral Daily    Assessment: 60 yo male with RLE DVT on heparin and to transition to apixaban -Hg= 14.1, plt= 268  Goal of Therapy:  Monitor platelets by anticoagulation protocol: Yes   Plan:   -Discontinue heparin  -Apixaban 10mg  po bid for 7 days then 5mg  po  bid -Patient educated on anticoagulation  Hildred Laser, PharmD Clinical Pharmacist **Pharmacist phone directory can now be found on [email or web address] ([email or web address]).  Listed under [place].

## 2020-12-12 NOTE — Progress Notes (Signed)
The patient discharged to home with significant other in personal car. He was transitioned to Eliquis for DVT prophalaxsis and sent home with 30 day supply of that and Atvorstatin. VS stable, he denies pain. Stable to d/c home.Fuller Canada, RN

## 2020-12-13 NOTE — Discharge Summary (Signed)
  Discharge Summary  Patient ID: Cameron James [id number] 59 y.o. 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  He was started on heparin.  Work-up included CT a/p which was negative for mass-effect or other obvious etiology.  Patient improved clinically with conservative measures including thigh high 20-65mmHg compression and elevation.  He was transitioned to Eliquis at discharge.  He will follow up with Dr. Donzetta James in 3-4 weeks to recheck RLE edema.  He was discharged in stable condition.  Consults: None  Treatments: anticoagulation: heparin --> Eliquis  Discharge Exam: See progress note 12/12/20 Vitals:   12/12/20 0814 12/12/20 1125  BP: 137/87 (!) 146/82  Pulse: 82 79  Resp: 18 18  Temp: 98 F (36.7 C) 98 F (36.7 C)  SpO2: 97% 100%     Disposition: Discharge disposition: 01-Home or Self Care       Patient Instructions:  Allergies as of 12/12/2020   No Known Allergies     Medication List    TAKE these medications   Apixaban Starter Pack (10mg  and 5mg ) Commonly known as: ELIQUIS STARTER PACK Take as directed on package: start with two-5mg  tablets twice daily for 7 days. On day 8, switch to one-5mg  tablet twice daily.   aspirin 81 MG EC tablet Take 1 tablet (81 mg total) by mouth daily. Swallow whole. What changed: additional instructions   atorvastatin 40 MG tablet Commonly  known as: LIPITOR Take 1 tablet (40 mg total) by mouth daily at 6 PM. What changed: when to take this   multivitamin with minerals Tabs tablet Take 1 tablet by mouth daily.      Activity: activity as tolerated Diet: regular diet Wound Care: none needed  Follow-up with Dr. Donzetta James in 3 weeks.  Signed: Dagoberto Ligas, PA-C 12/13/2020 8:25 AM [facility] Office: [phone]

## 2021-01-12 ENCOUNTER — Ambulatory Visit: Payer: BC Managed Care – PPO | Admitting: Vascular Surgery

## 2021-01-13 ENCOUNTER — Other Ambulatory Visit: Payer: Self-pay | Admitting: Vascular Surgery

## 2021-01-13 MED ORDER — APIXABAN 5 MG PO TABS: 5.0000 mg | ORAL_TABLET | Freq: Two times a day (BID) | ORAL | 2 refills | Status: DC

## 2021-01-13 NOTE — Telephone Encounter (Signed)
Missed his follow-up with Dr. Donzetta Matters yesterday.  Asking for refill of Eliquis.  Sent 5 mg BID to pharmacy.  He will call our office Monday to get his follow-up with Dr. Donzetta Matters rescheduled.

## 2021-01-15 ENCOUNTER — Other Ambulatory Visit: Payer: Self-pay | Admitting: Vascular Surgery

## 2021-01-22 ENCOUNTER — Ambulatory Visit: Payer: BC Managed Care – PPO | Admitting: Vascular Surgery

## 2021-01-27 ENCOUNTER — Other Ambulatory Visit: Payer: Self-pay | Admitting: Vascular Surgery

## 2021-02-02 ENCOUNTER — Encounter: Payer: Self-pay | Admitting: Vascular Surgery

## 2021-02-27 ENCOUNTER — Ambulatory Visit: Payer: BC Managed Care – PPO | Admitting: Vascular Surgery

## 2021-03-09 ENCOUNTER — Other Ambulatory Visit: Payer: Self-pay

## 2021-03-09 ENCOUNTER — Ambulatory Visit: Payer: BC Managed Care – PPO | Admitting: Vascular Surgery

## 2021-03-09 ENCOUNTER — Encounter: Payer: Self-pay | Admitting: Vascular Surgery

## 2021-03-09 VITALS — BP 116/76 | HR 77 | Temp 97.9°F | Resp 20 | Ht 73.0 in | Wt 236.0 lb

## 2021-03-09 DIAGNOSIS — I82411 Acute embolism and thrombosis of right femoral vein: Secondary | ICD-10-CM

## 2021-03-09 NOTE — Progress Notes (Signed)
Patient ID: Cameron James, male   DOB: [birth year], 60 y.o.   MRN: [id number]  Reason for Consult: Follow-up   Referred by No ref. provider found  Subjective:     HPI:  Cameron James is a 60 y.o. male initially evaluated in the hospital for sense of right lower extremity DVT.  We did not find any cause.  He is a Administrator.  He took blood thinners for approximately 6 weeks and then could not afford them.  No other previous DVTs no family history of DVT.  He has not had any bleeding issues.  He is a current every day smoker.  He denies any swelling of his right lower extremity.  He did have a colonoscopy 5 years ago.  Past Medical History:  Diagnosis Date  . Blood in stool    long ago- polyps removed per pt- > 15 yrs ago   . Diabetes mellitus without complication (HCC)    borderline- on metformin  . DVT (deep venous thrombosis) ([place])   . Hyperlipidemia    Family History  Problem Relation Age of Onset  . Diabetes Mother   . Hypertension Mother   . Heart disease Father   . Colon cancer Neg Hx   . Colon polyps Neg Hx   . Esophageal cancer Neg Hx   . Rectal cancer Neg Hx   . Stomach cancer Neg Hx    Past Surgical History:  Procedure Laterality Date  . POLYPECTOMY     in [place] [facility]- pt unsure when > 15 yrs ago     Short Social History:  Social History   Tobacco Use  . Smoking status: Current Every Day Smoker    Packs/day: 1.00    Types: Cigarettes  . Smokeless tobacco: Never Used  Substance Use Topics  . Alcohol use: Yes    Alcohol/week: 2.0 standard drinks    Types: 1 Cans of beer, 1 Shots of liquor per week    No Known Allergies  Current Outpatient Medications  Medication Sig Dispense Refill  . aspirin EC 81 MG EC tablet Take 1 tablet (81 mg total) by mouth daily. Swallow whole. 30 tablet 11  . atorvastatin (LIPITOR) 40 MG tablet TAKE 1 TABLET (40 MG TOTAL) BY MOUTH DAILY AT 6 PM. (Patient taking differently: Take 1 tablet by mouth daily. at 6pm) 30 tablet 1   . Multiple Vitamin (MULTIVITAMIN WITH MINERALS) TABS tablet Take 1 tablet by mouth daily.    Marland Kitchen ELIQUIS 5 MG TABS tablet TAKE 1 TABLET BY MOUTH TWICE A DAY (Patient not taking: Reported on 03/09/2021) 60 tablet 2   No current facility-administered medications for this visit.    Review of Systems  Constitutional:  Constitutional negative. HENT: HENT negative.  Eyes: Eyes negative.  Respiratory: Respiratory negative.  Cardiovascular: Cardiovascular negative.  GI: Gastrointestinal negative.  Musculoskeletal: Musculoskeletal negative.  Skin: Skin negative.  Neurological: Neurological negative. Hematologic: Hematologic/lymphatic negative.  Psychiatric: Psychiatric negative.        Objective:  Objective   Vitals:   03/09/21 0933  BP: 116/76  Pulse: 77  Resp: 20  Temp: 97.9 F (36.6 C)  SpO2: 97%  Weight: 236 lb (107 kg)  Height: 6\' 1"  (1.854 m)   Body mass index is 31.14 kg/m.  Physical Exam HENT:     Head: Normocephalic.     Nose:     Comments: Wearing a mask Eyes:     Pupils: Pupils are equal, round, and reactive to light.  Cardiovascular:  Rate and Rhythm: Normal rate.     Pulses: Normal pulses.  Pulmonary:     Effort: Pulmonary effort is normal.  Abdominal:     General: Abdomen is flat.     Palpations: Abdomen is soft. There is no mass.  Musculoskeletal:     Right lower leg: No edema.  Skin:    General: Skin is warm.     Capillary Refill: Capillary refill takes less than 2 seconds.  Neurological:     General: No focal deficit present.     Mental Status: He is alert.  Psychiatric:        Mood and Affect: Mood normal.        Behavior: Behavior normal.        Thought Content: Thought content normal.        Judgment: Judgment normal.     Data: No studies today.  I did review his previous CT venogram.     Assessment/Plan:     60 year old male with recent history of extensive right lower extremity DVT.  He completed 6 weeks of treatment is now 3  months out from DVT.  For this reason I have discussed taking baby aspirin daily indefinitely and to watch for signs symptoms of DVT which would require urgent medical evaluation.  I discussed the need for smoking cessation.  He can follow-up with me on an as-needed basis.     Waynetta Sandy MD [facility]

## 2021-12-14 ENCOUNTER — Emergency Department (HOSPITAL_COMMUNITY)
Admission: EM | Admit: 2021-12-14 | Discharge: 2021-12-15 | Disposition: A | Discharge status: Home / Self Care | Payer: BC Managed Care – PPO | Attending: Emergency Medicine | Admitting: Emergency Medicine

## 2021-12-14 ENCOUNTER — Emergency Department (HOSPITAL_COMMUNITY): Payer: BC Managed Care – PPO

## 2021-12-14 ENCOUNTER — Other Ambulatory Visit: Payer: Self-pay

## 2021-12-14 DIAGNOSIS — Z20822 Contact with and (suspected) exposure to covid-19: Secondary | ICD-10-CM | POA: Insufficient documentation

## 2021-12-14 DIAGNOSIS — Z7984 Long term (current) use of oral hypoglycemic drugs: Secondary | ICD-10-CM | POA: Insufficient documentation

## 2021-12-14 DIAGNOSIS — E1169 Type 2 diabetes mellitus with other specified complication: Secondary | ICD-10-CM

## 2021-12-14 DIAGNOSIS — R531 Weakness: Secondary | ICD-10-CM | POA: Diagnosis not present

## 2021-12-14 DIAGNOSIS — Z7901 Long term (current) use of anticoagulants: Secondary | ICD-10-CM | POA: Insufficient documentation

## 2021-12-14 DIAGNOSIS — E1165 Type 2 diabetes mellitus with hyperglycemia: Secondary | ICD-10-CM | POA: Diagnosis not present

## 2021-12-14 DIAGNOSIS — Z7982 Long term (current) use of aspirin: Secondary | ICD-10-CM | POA: Diagnosis not present

## 2021-12-14 DIAGNOSIS — R739 Hyperglycemia, unspecified: Secondary | ICD-10-CM

## 2021-12-14 DIAGNOSIS — R631 Polydipsia: Secondary | ICD-10-CM | POA: Diagnosis not present

## 2021-12-14 LAB — URINALYSIS, ROUTINE W REFLEX MICROSCOPIC
Bacteria, UA: NONE SEEN
Bilirubin Urine: NEGATIVE
Glucose, UA: >=500 mg/dL — AB
Hgb urine dipstick: NEGATIVE
Ketones, ur: 80 mg/dL — AB
Leukocytes,Ua: NEGATIVE
Nitrite: NEGATIVE
Protein, ur: NEGATIVE mg/dL
Specific Gravity, Urine: 1.03 (ref 1.005–1.030)
pH: 5 (ref 5.0–8.0)

## 2021-12-14 LAB — CBC
HCT: 48.2 % (ref 39.0–52.0)
Hemoglobin: 16.9 g/dL (ref 13.0–17.0)
MCH: 30.7 pg (ref 26.0–34.0)
MCHC: 35.1 g/dL (ref 30.0–36.0)
MCV: 87.6 fL (ref 80.0–100.0)
Platelets: 159 10*3/uL (ref 150–400)
RBC: 5.5 MIL/uL (ref 4.22–5.81)
RDW: 12.4 % (ref 11.5–15.5)
WBC: 7.5 10*3/uL (ref 4.0–10.5)
nRBC: 0 % (ref 0.0–0.2)

## 2021-12-14 LAB — RESP PANEL BY RT-PCR (FLU A&B, COVID) ARPGX2
Influenza A by PCR: NEGATIVE
Influenza B by PCR: NEGATIVE
SARS Coronavirus 2 by RT PCR: NEGATIVE

## 2021-12-14 LAB — BASIC METABOLIC PANEL
Anion gap: 13 (ref 5–15)
BUN: 24 mg/dL — ABNORMAL HIGH (ref 6–20)
CO2: 21 mmol/L — ABNORMAL LOW (ref 22–32)
Calcium: 9.2 mg/dL (ref 8.9–10.3)
Chloride: 93 mmol/L — ABNORMAL LOW (ref 98–111)
Creatinine, Ser: 1.22 mg/dL (ref 0.61–1.24)
GFR, Estimated: >60 mL/min (ref >60)
Glucose, Bld: 798 mg/dL (ref 70–99)
Potassium: 5.1 mmol/L (ref 3.5–5.1)
Sodium: 127 mmol/L — ABNORMAL LOW (ref 135–145)

## 2021-12-14 LAB — CBG MONITORING, ED
Glucose-Capillary: 488 mg/dL — ABNORMAL HIGH (ref 70–99)
Glucose-Capillary: >600 mg/dL (ref 70–99)

## 2021-12-14 MED ORDER — METFORMIN HCL 500 MG PO TABS
500.0000 mg | ORAL_TABLET | Freq: Once | ORAL | Status: AC
  Administered 2021-12-14: 500 mg via ORAL
  Filled 2021-12-14: qty 1

## 2021-12-14 MED ORDER — METFORMIN HCL 500 MG PO TABS: 500.0000 mg | ORAL_TABLET | Freq: Two times a day (BID) | ORAL | 1 refills | Status: DC

## 2021-12-14 MED ORDER — SODIUM CHLORIDE 0.9 % IV BOLUS
2000.0000 mL | Freq: Once | INTRAVENOUS | Status: AC
  Administered 2021-12-14: 2000 mL via INTRAVENOUS

## 2021-12-14 NOTE — ED Provider Notes (Signed)
?[facility] ?Provider Note ? ? ?CSN: [id number] ?Arrival date & time: 12/14/21  1948 ? ?  ? ?History ? ?Chief Complaint  ?Patient presents with  ? Weakness  ? Increased Thirst  ? ? ?Cameron James is a 61 y.o. male. ? ?HPI ?He presents for evaluation of increased thirst and polyuria, over the last several weeks.  He has also noticed some blurred vision and tingling in his toes.  He stopped taking his medications for diabetes, last year.  He also has not taken his anticoagulant since the initial prescription.  This was about a year ago. ?  ? ?Home Medications ?Prior to Admission medications   ?Medication Sig Start Date End Date Taking? Authorizing Provider  ?metFORMIN (GLUCOPHAGE) 500 MG tablet Take 1 tablet (500 mg total) by mouth 2 (two) times daily with a meal. 12/14/21  Yes Daleen Bo, MD  ?aspirin EC 81 MG EC tablet Take 1 tablet (81 mg total) by mouth daily. Swallow whole. 12/13/20   Dagoberto Ligas, PA-C  ?atorvastatin (LIPITOR) 40 MG tablet TAKE 1 TABLET (40 MG TOTAL) BY MOUTH DAILY AT 6 PM. ?Patient taking differently: Take 1 tablet by mouth daily. at 6pm 12/12/20 12/12/21  Dagoberto Ligas, PA-C  ?ELIQUIS 5 MG TABS tablet TAKE 1 TABLET BY MOUTH TWICE A DAY ?Patient not taking: Reported on 03/09/2021 01/28/21   Waynetta Sandy, MD  ?Multiple Vitamin (MULTIVITAMIN WITH MINERALS) TABS tablet Take 1 tablet by mouth daily.    [provider]  ?   ? ?Allergies    ?Patient has no known allergies.   ? ?Review of Systems   ?Review of Systems ? ?Physical Exam ?Updated Vital Signs ?BP 120/75 (BP Location: Right Arm)   Pulse 98   Temp 98.8 ?F (37.1 ?C) (Oral)   Resp 17   Ht 6\' 1"  (1.854 m)   Wt 104.3 kg   SpO2 99%   BMI 30.34 kg/m?  ?Physical Exam ?Vitals and nursing note reviewed.  ?Constitutional:   ?   General: He is not in acute distress. ?   Appearance: He is well-developed. He is not ill-appearing or diaphoretic.  ?HENT:  ?   Head: Normocephalic and atraumatic.  ?   Right  Ear: External ear normal.  ?   Left Ear: External ear normal.  ?Eyes:  ?   Conjunctiva/sclera: Conjunctivae normal.  ?   Pupils: Pupils are equal, round, and reactive to light.  ?Neck:  ?   Trachea: Phonation normal.  ?Cardiovascular:  ?   Rate and Rhythm: Normal rate and regular rhythm.  ?   Heart sounds: Normal heart sounds.  ?Pulmonary:  ?   Effort: Pulmonary effort is normal. No respiratory distress.  ?   Breath sounds: Normal breath sounds. No stridor.  ?Abdominal:  ?   General: There is no distension.  ?   Palpations: Abdomen is soft.  ?   Tenderness: There is no abdominal tenderness.  ?Musculoskeletal:     ?   General: Normal range of motion.  ?   Cervical back: Normal range of motion and neck supple.  ?Skin: ?   General: Skin is warm and dry.  ?Neurological:  ?   Mental Status: He is alert and oriented to person, place, and time.  ?   Cranial Nerves: No cranial nerve deficit.  ?   Sensory: No sensory deficit.  ?   Motor: No abnormal muscle tone.  ?   Coordination: Coordination normal.  ?Psychiatric:     ?  Mood and Affect: Mood normal.     ?   Behavior: Behavior normal.     ?   Thought Content: Thought content normal.     ?   Judgment: Judgment normal.  ? ? ?ED Results / Procedures / Treatments   ?Labs ?(all labs ordered are listed, but only abnormal results are displayed) ?Labs Reviewed  ?BASIC METABOLIC PANEL - Abnormal; Notable for the following components:  ?    Result Value  ? Sodium 127 (*)   ? Chloride 93 (*)   ? CO2 21 (*)   ? Glucose, Bld 798 (*)   ? BUN 24 (*)   ? All other components within normal limits  ?URINALYSIS, ROUTINE W REFLEX MICROSCOPIC - Abnormal; Notable for the following components:  ? Color, Urine STRAW (*)   ? Glucose, UA >=500 (*)   ? Ketones, ur 80 (*)   ? All other components within normal limits  ?CBG MONITORING, ED - Abnormal; Notable for the following components:  ? Glucose-Capillary >600 (*)   ? All other components within normal limits  ?RESP PANEL BY RT-PCR (FLU A&B,  COVID) ARPGX2  ?CBC  ?CBG MONITORING, ED  ? ? ?EKG ?None ? ?Radiology ?DG Chest 2 View ? ?Result Date: 12/14/2021 ?CLINICAL DATA:  Increased weakness over the past 3 weeks EXAM: CHEST - 2 VIEW COMPARISON:  None. FINDINGS: The heart size and mediastinal contours are within normal limits. Both lungs are clear. The visualized skeletal structures are unremarkable. IMPRESSION: No active cardiopulmonary disease. Electronically Signed   By: Inez Catalina M.D.   On: 12/14/2021 21:36   ? ?Procedures ?Procedures  ? ? ?Medications Ordered in ED ?Medications  ?sodium chloride 0.9 % bolus 2,000 mL (2,000 mLs Intravenous New Bag/Given 12/14/21 2230)  ?metFORMIN (GLUCOPHAGE) tablet 500 mg (500 mg Oral Given 12/14/21 2311)  ? ? ?ED Course/ Medical Decision Making/ A&P ?  ?                        ?Medical Decision Making ?Patient presenting with signs and symptoms of uncontrolled diabetes.  He is not taking his prescribed medicines, because of lack PCP care. ? ?Amount and/or Complexity of Data Reviewed ?Independent Historian:  ?   Details: He is a cogent historian ?Labs: ordered. ?   Details: CBC, metabolic panel, urinalysis-normal except sodium low, glucose high ?Radiology: ordered. ? ?Risk ?Prescription drug management. ?Decision regarding hospitalization. ?Risk Details: Patient noncompliant presenting with primary symptoms of uncontrolled blood sugar.  He has hyperglycemia without DKA.  He was treated with IV fluids in the ED and started back on metformin.  He is stable for discharge with outpatient management.  He is encouraged to find a PCP ASAP ? ? ? ? ? ? ? ? ? ? ?Final Clinical Impression(s) / ED Diagnoses ?Final diagnoses:  ?Type 2 diabetes mellitus with other specified complication, without long-term current use of insulin ([place])  ?Hyperglycemia  ? ? ?Rx / DC Orders ?ED Discharge Orders   ? ?      Ordered  ?  metFORMIN (GLUCOPHAGE) 500 MG tablet  2 times daily with meals       ? 12/14/21 2219  ? ?  ?  ? ?  ? ? ?  ?Daleen Bo,  MD ?12/14/21 2318 ? ?

## 2021-12-14 NOTE — Discharge Instructions (Addendum)
It is very important to stay on a low carbohydrate diet.  See attached information to help with that.  Also drinking 2 L of water every day will improve your condition.  Follow-up with your primary care doctor as soon as possible.  See the attached information to find a doctor. ?

## 2021-12-14 NOTE — ED Triage Notes (Signed)
Generalized weakness and increased thirst x 3 weeks. Denies nvd, denies cp or sob, denies fever. Pt hasn't taken his medications in over a year. Pt reports he needs a PCP.  ?

## 2021-12-15 IMAGING — CT CT VENOGRAM ABD-PELV
2 of 9 series · 13 of 46 positions shown, 18 images · IV contrast (Omni 300)
Comparison: 12/11/2020

CLINICAL DATA: DVT right lower extremity,, right leg pain and
swelling

EXAM:
CT ABDOMEN AND PELVIS WITH CONTRAST
TECHNIQUE: Multidetector CT imaging of the abdomen and pelvis was performed
using the standard protocol following bolus administration of
intravenous contrast.
CONTRAST:  100mL OMNIPAQUE IOHEXOL 350 MG/ML SOLN

[Series 3: (person_name)/ 5mm · axial · 0.96mm/px · z∈[+380,+1355]mm · 11 of 221 slices shown, 15 images]
[im 13/221  soft-tissue]
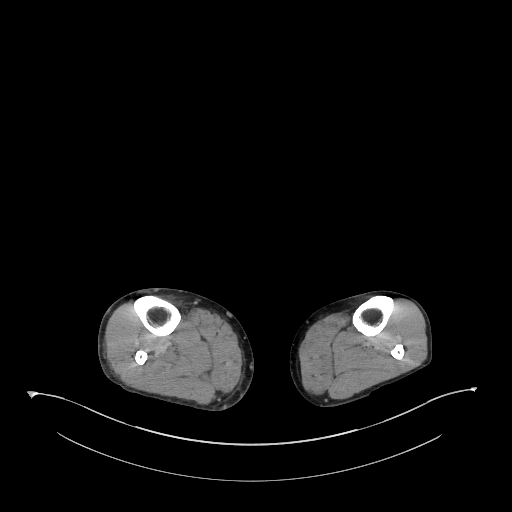
[im 13/221  bone]
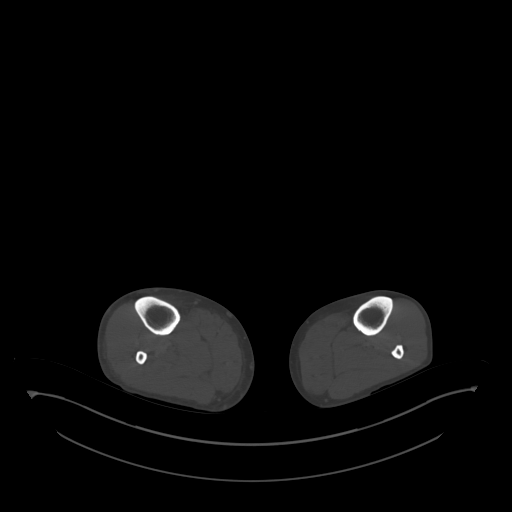
[im 39/221  soft-tissue]
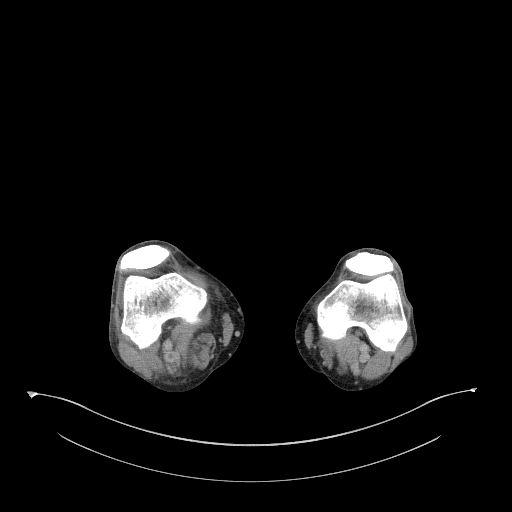
[im 65/221  soft-tissue]
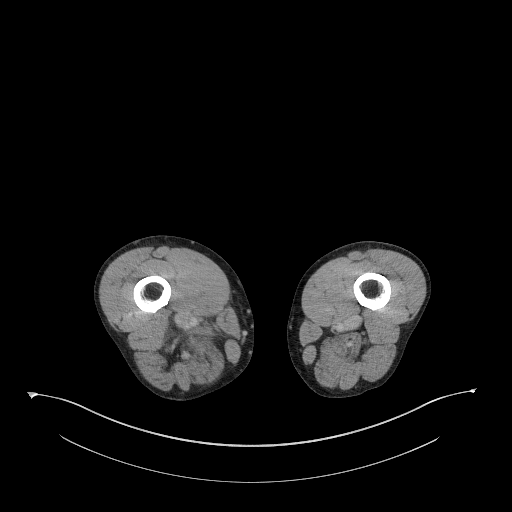
[im 91/221  soft-tissue]
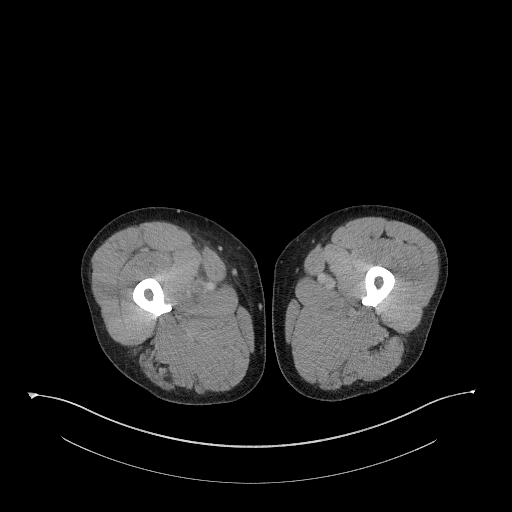
[im 117/221  soft-tissue]
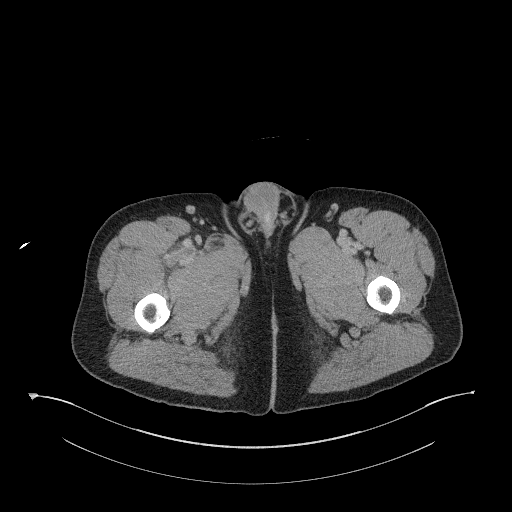
[im 130/221  soft-tissue]
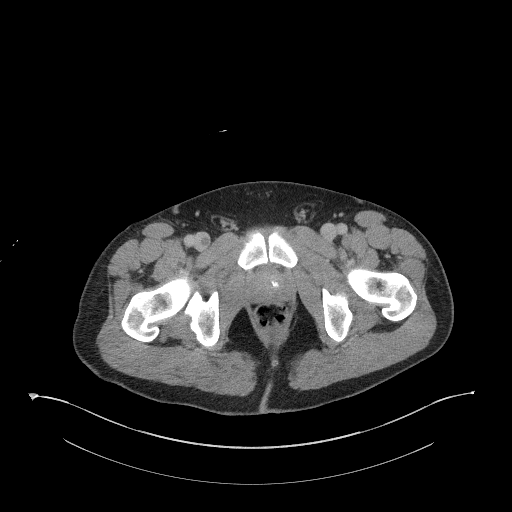
[im 156/221  soft-tissue]
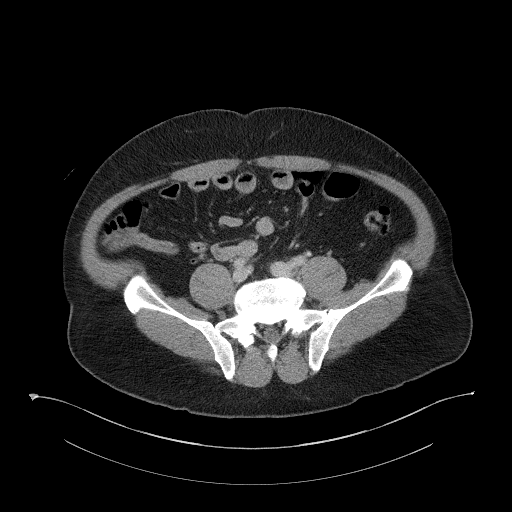
[im 169/221  lung]
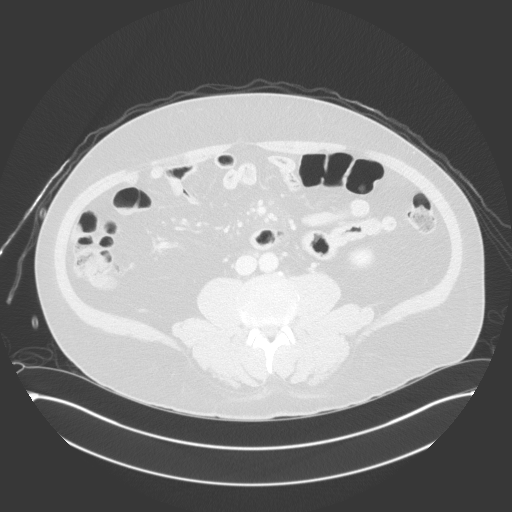
[im 182/221  soft-tissue]
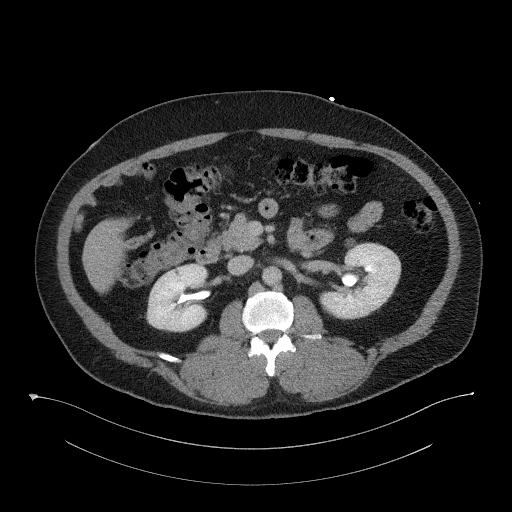
[im 182/221  lung]
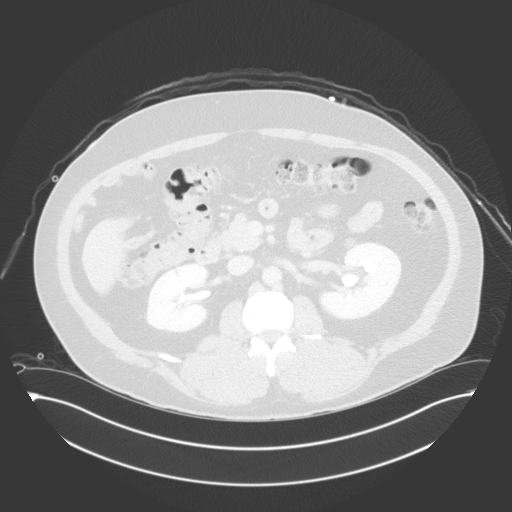
[im 195/221  lung]
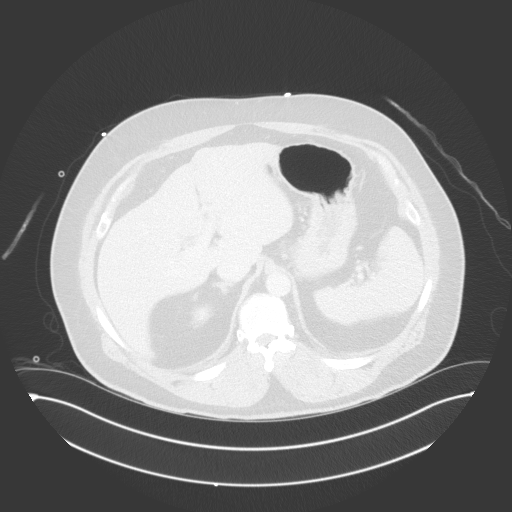
[im 208/221  soft-tissue]
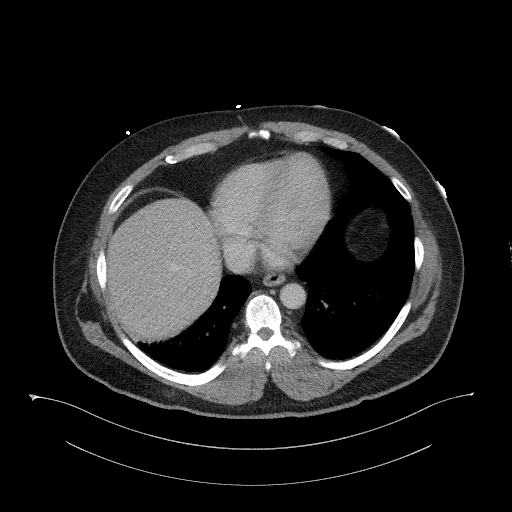
[im 208/221  lung]
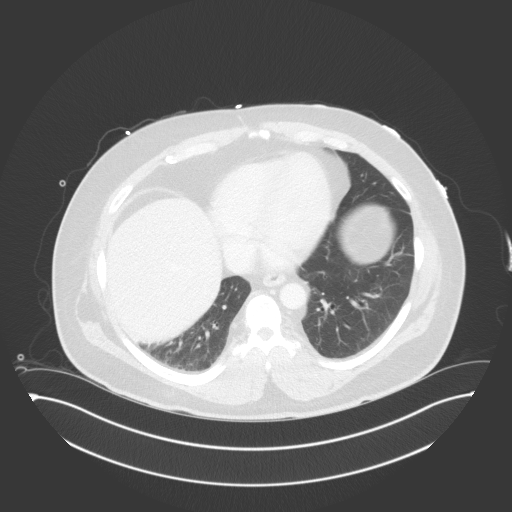
[im 208/221  bone]
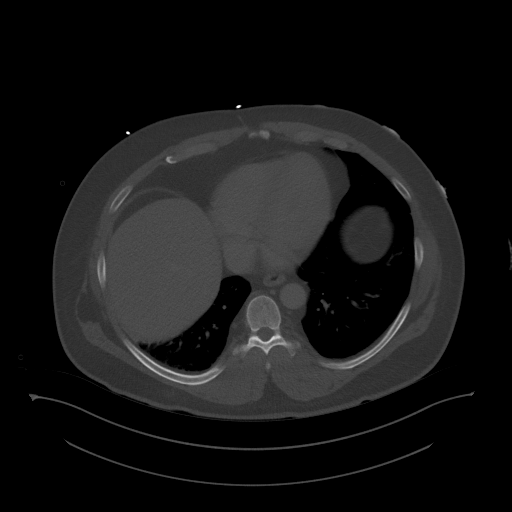

[Series 11: (person_name)/ cor · coronal · 0.79mm/px · 2 of 182 slices shown, 3 images]
[im 61/182  soft-tissue]
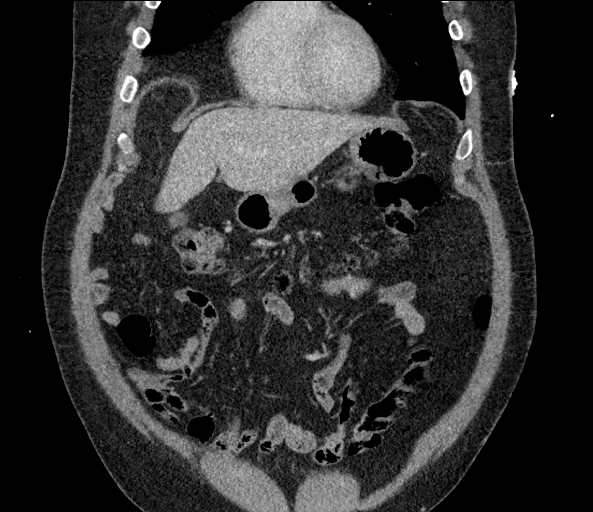
[im 61/182  bone]
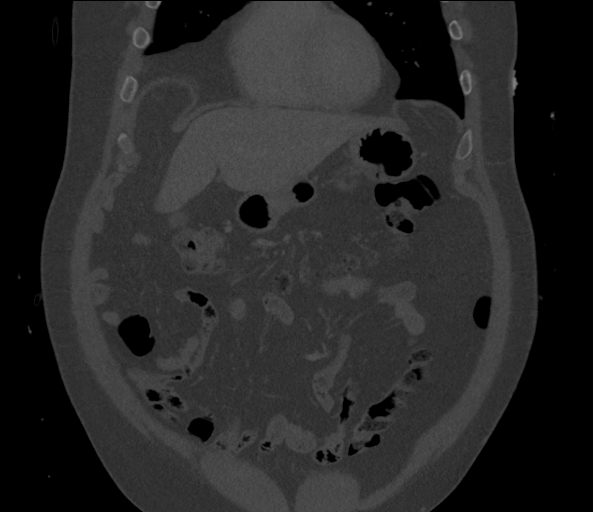
[im 121/182  soft-tissue]
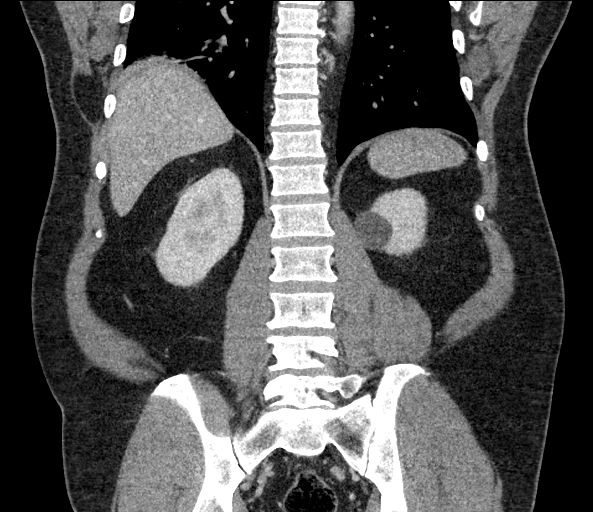

[13 of 46 positions shown; findings below may reference images not displayed]

FINDINGS: Lower chest: Hypoventilatory changes at the lung bases. No acute
pleural or parenchymal lung disease.

Hepatobiliary: No focal liver abnormality is seen. No gallstones,
gallbladder wall thickening, or biliary dilatation.

Pancreas: Unremarkable. No pancreatic ductal dilatation or
surrounding inflammatory changes.

Spleen: Normal in size without focal abnormality.

Adrenals/Urinary Tract: Multiple left renal cortical cyst. Otherwise
the kidneys enhance normally. No urinary tract calculi or
obstruction. The adrenals and bladder are unremarkable.

Stomach/Bowel: No bowel obstruction or ileus. Normal appendix right
lower quadrant. No bowel wall thickening or inflammatory change.

Vascular/Lymphatic: Aorta is unremarkable. No evidence of
atherosclerosis.

The deep venous thrombosis seen on the preceding ultrasound is again
identified, extending from the right common femoral vein through the
popliteal vein. The right external iliac vein, common iliac vein,
and IVC are unremarkable with no evidence of thrombus. No evidence
of DVT within the left lower extremity.

No pathologic adenopathy.

Reproductive: Prostate is unremarkable.

Other: No free fluid or free gas.  No abdominal wall hernia.

Musculoskeletal: No acute or destructive bony lesions. Reconstructed
images demonstrate no additional findings.
IMPRESSION: 1. Right lower extremity deep venous thrombosis extending from the
right common femoral through the popliteal veins.
2. Otherwise no acute intra-abdominal or intrapelvic process.

## 2021-12-18 DIAGNOSIS — R42 Dizziness and giddiness: Secondary | ICD-10-CM | POA: Diagnosis not present

## 2021-12-18 DIAGNOSIS — E119 Type 2 diabetes mellitus without complications: Secondary | ICD-10-CM | POA: Diagnosis not present

## 2021-12-31 ENCOUNTER — Encounter: Payer: Self-pay | Admitting: Gastroenterology

## 2022-01-22 ENCOUNTER — Encounter: Payer: Self-pay | Admitting: Family Medicine

## 2022-01-22 ENCOUNTER — Ambulatory Visit: Payer: BC Managed Care – PPO | Admitting: Family Medicine

## 2022-01-22 VITALS — BP 136/80 | HR 72 | Temp 98.5°F | Ht 73.0 in | Wt 234.4 lb

## 2022-01-22 DIAGNOSIS — E669 Obesity, unspecified: Secondary | ICD-10-CM | POA: Insufficient documentation

## 2022-01-22 DIAGNOSIS — Z125 Encounter for screening for malignant neoplasm of prostate: Secondary | ICD-10-CM | POA: Diagnosis not present

## 2022-01-22 DIAGNOSIS — Z130 Encounter for screening for diseases of the blood and blood-forming organs and certain disorders involving the immune mechanism: Secondary | ICD-10-CM

## 2022-01-22 DIAGNOSIS — E785 Hyperlipidemia, unspecified: Secondary | ICD-10-CM | POA: Diagnosis not present

## 2022-01-22 DIAGNOSIS — E1165 Type 2 diabetes mellitus with hyperglycemia: Secondary | ICD-10-CM | POA: Diagnosis not present

## 2022-01-22 DIAGNOSIS — Z86718 Personal history of other venous thrombosis and embolism: Secondary | ICD-10-CM

## 2022-01-22 DIAGNOSIS — E1169 Type 2 diabetes mellitus with other specified complication: Secondary | ICD-10-CM | POA: Insufficient documentation

## 2022-01-22 MED ORDER — GLIPIZIDE 5 MG PO TABS: 5.0000 mg | ORAL_TABLET | Freq: Every day | ORAL | 1 refills | Status: DC

## 2022-01-22 MED ORDER — METFORMIN HCL 1000 MG PO TABS: 1000.0000 mg | ORAL_TABLET | Freq: Two times a day (BID) | ORAL | 3 refills | Status: DC

## 2022-01-22 MED ORDER — BLOOD GLUCOSE MONITOR KIT: PACK | 0 refills | Status: DC

## 2022-01-22 NOTE — Assessment & Plan Note (Signed)
Unsure of current status.  Will likely need statin therapy.  Labs today. ?

## 2022-01-22 NOTE — Assessment & Plan Note (Signed)
Diabetes is uncontrolled.  A1c today.  Increasing metformin to 1000 mg twice daily.  Continue glipizide.  Will likely need to see endocrinology.  May need insulin therapy.  Patient is requesting CGM.  I will send this in after A1c returns. ?

## 2022-01-22 NOTE — Progress Notes (Signed)
? ?Subjective:  ?Patient ID: Cameron James, male    DOB: 22-Nov-1960  Age: 61 y.o. MRN: [id number] ? ?CC: Establish care ? ?HPI ?Cameron James is a 61 y.o. male with type 2 diabetes, hyperlipidemia, obesity, history of DVT presents to establish care. ? ?Patient recently seen in the ER.  Has been experiencing polyuria and polydipsia.  He was seen on 3/3.  Laboratory studies revealed markedly elevated glucose of 798.  He was given IV fluids and was discharged home on metformin. ? ?Patient states that his polyuria and polydipsia has improved.  He is not checking his blood sugars.  He is compliant with metformin 500 mg twice daily.  He is also on glipizide 5 mg daily.  No recent A1c. ? ?Patient has a history of hyperlipidemia.  He is not on any medication at this time.  He was previously on atorvastatin. ? ?History of DVT.  He has previously seen vascular surgery.  He was treated with 3 months of Eliquis and this was discontinued.  He was advised to take aspirin daily.  He endorses compliance. ? ?Patient states that he is currently feeling well.  No chest pain or shortness of breath. ? ?PMH, Surgical Hx, Family Hx, Social History reviewed and updated as below. ? ?Past Medical History:  ?Diagnosis Date  ? Diabetes mellitus without complication ([place])   ? DVT (deep venous thrombosis) ([place])   ? Hyperlipidemia   ? ?Past Surgical History:  ?Procedure Laterality Date  ? POLYPECTOMY    ? in [place]- pt unsure when > 15 yrs ago   ? ?Family History  ?Problem Relation Age of Onset  ? Diabetes Mother   ? Hypertension Mother   ? Heart disease Father   ? Colon cancer Neg Hx   ? Colon polyps Neg Hx   ? Esophageal cancer Neg Hx   ? Rectal cancer Neg Hx   ? Stomach cancer Neg Hx   ? ?Social History  ? ?Tobacco Use  ? Smoking status: Every Day  ?  Packs/day: 1.00  ?  Types: Cigarettes  ? Smokeless tobacco: Never  ?Substance Use Topics  ? Alcohol use: Yes  ?  Alcohol/week: 2.0 standard drinks  ?  Types: 1 Cans of beer, 1 Shots of  liquor per week  ? ?Review of Systems  ?Constitutional: Negative.   ?Respiratory: Negative.    ?Cardiovascular: Negative.   ? ?Objective:  ? ?Today's Vitals: BP 136/80   Pulse 72   Temp 98.5 ?F (36.9 ?C) (Oral)   Ht $R'6\' 1"'sb$  (1.854 m)   Wt 234 lb 6.4 oz (106.3 kg)   SpO2 96%   BMI 30.93 kg/m?  ? ?Physical Exam ?Vitals and nursing note reviewed.  ?Constitutional:   ?   General: He is not in acute distress. ?   Appearance: Normal appearance. He is obese. He is not ill-appearing.  ?HENT:  ?   Head: Normocephalic and atraumatic.  ?Eyes:  ?   General:     ?   Right eye: No discharge.     ?   Left eye: No discharge.  ?   Conjunctiva/sclera: Conjunctivae normal.  ?Cardiovascular:  ?   Rate and Rhythm: Normal rate and regular rhythm.  ?Pulmonary:  ?   Effort: Pulmonary effort is normal.  ?   Breath sounds: Normal breath sounds. No wheezing, rhonchi or rales.  ?Abdominal:  ?   General: There is no distension.  ?   Palpations: Abdomen is soft.  ?   Tenderness:  There is no abdominal tenderness.  ?Neurological:  ?   Mental Status: He is alert.  ?Psychiatric:     ?   Mood and Affect: Mood normal.     ?   Behavior: Behavior normal.  ? ? ? ?Assessment & Plan:  ? ?Problem List Items Addressed This Visit   ? ?  ? Endocrine  ? Hyperlipidemia associated with type 2 diabetes mellitus ([place])  ?  Unsure of current status.  Will likely need statin therapy.  Labs today. ?  ?  ? Relevant Medications  ? glipiZIDE (GLUCOTROL) 5 MG tablet  ? metFORMIN (GLUCOPHAGE) 1000 MG tablet  ? Other Relevant Orders  ? Lipid panel  ? Uncontrolled type 2 diabetes mellitus with hyperglycemia ([facility]) - Primary  ?  Diabetes is uncontrolled.  A1c today.  Increasing metformin to 1000 mg twice daily.  Continue glipizide.  Will likely need to see endocrinology.  May need insulin therapy.  Patient is requesting CGM.  I will send this in after A1c returns. ?  ?  ? Relevant Medications  ? glipiZIDE (GLUCOTROL) 5 MG tablet  ? metFORMIN (GLUCOPHAGE) 1000 MG tablet  ?  Other Relevant Orders  ? CMP14+EGFR  ? Hemoglobin A1c  ? Microalbumin / creatinine urine ratio  ?  ? Other  ? Obesity (BMI 30-39.9)  ? Relevant Medications  ? glipiZIDE (GLUCOTROL) 5 MG tablet  ? metFORMIN (GLUCOPHAGE) 1000 MG tablet  ? History of DVT (deep vein thrombosis)  ?  Continue daily aspirin therapy. ?  ?  ? ?Other Visit Diagnoses   ? ? Screening for deficiency anemia      ? Relevant Orders  ? CBC  ? Prostate cancer screening      ? Relevant Orders  ? PSA  ? ?  ? ? ?Meds ordered this encounter  ?Medications  ? glipiZIDE (GLUCOTROL) 5 MG tablet  ?  Sig: Take 1 tablet (5 mg total) by mouth daily before breakfast.  ?  Dispense:  90 tablet  ?  Refill:  1  ? metFORMIN (GLUCOPHAGE) 1000 MG tablet  ?  Sig: Take 1 tablet (1,000 mg total) by mouth 2 (two) times daily with a meal.  ?  Dispense:  180 tablet  ?  Refill:  3  ? blood glucose meter kit and supplies KIT  ?  Sig: Dispense based on patient and insurance preference. Use up to four times daily as directed.  ?  Dispense:  1 each  ?  Refill:  0  ?  Order Specific Question:   Number of strips  ?  Answer:   100  ?  Order Specific Question:   Number of lancets  ?  Answer:   100  ? ? ? ?Follow-up: Return in about 1 month (around 02/21/2022). ? ?Thersa Salt DO ?[facility] ? ? ? ? ?

## 2022-01-22 NOTE — Assessment & Plan Note (Signed)
Continue daily aspirin therapy. ?

## 2022-01-22 NOTE — Patient Instructions (Signed)
Medication as prescribed. ? ?Labs today.  ? ?Follow up in 1 month (pending labs). ? ?Take care ? ?Dr. Lacinda Axon  ?

## 2022-01-23 ENCOUNTER — Other Ambulatory Visit: Payer: Self-pay | Admitting: Family Medicine

## 2022-01-23 DIAGNOSIS — E1165 Type 2 diabetes mellitus with hyperglycemia: Secondary | ICD-10-CM

## 2022-01-23 MED ORDER — ROSUVASTATIN CALCIUM 20 MG PO TABS: 20.0000 mg | ORAL_TABLET | Freq: Every day | ORAL | 3 refills | Status: DC

## 2022-01-26 LAB — CMP14+EGFR
ALT: 17 IU/L (ref 0–44)
AST: 17 IU/L (ref 0–40)
Albumin/Globulin Ratio: 1.8 (ref 1.2–2.2)
Albumin: 4.4 g/dL (ref 3.8–4.9)
Alkaline Phosphatase: 83 IU/L (ref 44–121)
BUN/Creatinine Ratio: 10 (ref 9–20)
BUN: 9 mg/dL (ref 6–24)
Bilirubin Total: 0.2 mg/dL (ref 0.0–1.2)
CO2: 25 mmol/L (ref 20–29)
Calcium: 9.8 mg/dL (ref 8.7–10.2)
Chloride: 102 mmol/L (ref 96–106)
Creatinine, Ser: 0.87 mg/dL (ref 0.76–1.27)
Globulin, Total: 2.5 g/dL (ref 1.5–4.5)
Glucose: 237 mg/dL — ABNORMAL HIGH (ref 70–99)
Potassium: 4.5 mmol/L (ref 3.5–5.2)
Sodium: 142 mmol/L (ref 134–144)
Total Protein: 6.9 g/dL (ref 6.0–8.5)
eGFR: 100 mL/min/{1.73_m2} (ref >59)

## 2022-01-26 LAB — CBC
Hematocrit: 44.9 % (ref 37.5–51.0)
Hemoglobin: 14.9 g/dL (ref 13.0–17.7)
MCH: 28.8 pg (ref 26.6–33.0)
MCHC: 33.2 g/dL (ref 31.5–35.7)
MCV: 87 fL (ref 79–97)
Platelets: 318 10*3/uL (ref 150–450)
RBC: 5.17 x10E6/uL (ref 4.14–5.80)
RDW: 12.4 % (ref 11.6–15.4)
WBC: 10 10*3/uL (ref 3.4–10.8)

## 2022-01-26 LAB — LIPID PANEL
Chol/HDL Ratio: 6.4 ratio — ABNORMAL HIGH (ref 0.0–5.0)
Cholesterol, Total: 236 mg/dL — ABNORMAL HIGH (ref 100–199)
HDL: 37 mg/dL — ABNORMAL LOW (ref >39)
LDL Chol Calc (NIH): 170 mg/dL — ABNORMAL HIGH (ref 0–99)
Triglycerides: 159 mg/dL — ABNORMAL HIGH (ref 0–149)
VLDL Cholesterol Cal: 29 mg/dL (ref 5–40)

## 2022-01-26 LAB — MICROALBUMIN / CREATININE URINE RATIO
Creatinine, Urine: 65.2 mg/dL
Microalb/Creat Ratio: 19 mg/g creat (ref 0–29)
Microalbumin, Urine: 12.7 ug/mL

## 2022-01-26 LAB — HEMOGLOBIN A1C
Est. average glucose Bld gHb Est-mCnc: 352 mg/dL
Hgb A1c MFr Bld: 13.9 % — ABNORMAL HIGH (ref 4.8–5.6)

## 2022-01-26 LAB — PSA: Prostate Specific Ag, Serum: 2.2 ng/mL (ref 0.0–4.0)

## 2022-02-07 ENCOUNTER — Encounter: Payer: Self-pay | Admitting: Nurse Practitioner

## 2022-02-07 ENCOUNTER — Ambulatory Visit: Payer: BC Managed Care – PPO | Admitting: Nurse Practitioner

## 2022-02-07 VITALS — BP 149/70 | HR 71 | Ht 72.0 in | Wt 229.0 lb

## 2022-02-07 DIAGNOSIS — E782 Mixed hyperlipidemia: Secondary | ICD-10-CM

## 2022-02-07 DIAGNOSIS — I10 Essential (primary) hypertension: Secondary | ICD-10-CM

## 2022-02-07 DIAGNOSIS — E119 Type 2 diabetes mellitus without complications: Secondary | ICD-10-CM

## 2022-02-07 NOTE — Patient Instructions (Signed)

## 2022-02-07 NOTE — Progress Notes (Signed)
? ?                                                    Endocrinology Consult Note  ?     02/07/2022, 10:36 AM ? ? ?Subjective:  ? ? Patient ID: Cameron James, male    DOB: May 16, 1961.  ?Cameron James is being seen in consultation for management of currently uncontrolled symptomatic diabetes requested by  Coral Spikes, DO. ? ? ?Past Medical History:  ?Diagnosis Date  ? Diabetes mellitus without complication ([place])   ? DVT (deep venous thrombosis) ([place])   ? Hyperlipidemia   ? ? ?Past Surgical History:  ?Procedure Laterality Date  ? POLYPECTOMY    ? in [place]- pt unsure when > 15 yrs ago   ? ? ?Social History  ? ?Socioeconomic History  ? Marital status: Single  ?  Spouse name: Not on file  ? Number of children: 1  ? Years of education: Not on file  ? Highest education level: Not on file  ?Occupational History  ? Not on file  ?Tobacco Use  ? Smoking status: Every Day  ?  Packs/day: 1.00  ?  Types: Cigarettes  ? Smokeless tobacco: Never  ?Vaping Use  ? Vaping Use: Never used  ?Substance and Sexual Activity  ? Alcohol use: Yes  ?  Alcohol/week: 2.0 standard drinks  ?  Types: 1 Cans of beer, 1 Shots of liquor per week  ? Drug use: No  ? Sexual activity: Not Currently  ?Other Topics Concern  ? Not on file  ?Social History Narrative  ? Not on file  ? ?Social Determinants of Health  ? ?Financial Resource Strain: Not on file  ?Food Insecurity: Not on file  ?Transportation Needs: Not on file  ?Physical Activity: Not on file  ?Stress: Not on file  ?Social Connections: Not on file  ? ? ?Family History  ?Problem Relation Age of Onset  ? Diabetes Mother   ? Hypertension Mother   ? Heart disease Father   ? Colon cancer Neg Hx   ? Colon polyps Neg Hx   ? Esophageal cancer Neg Hx   ? Rectal cancer Neg Hx   ? Stomach cancer Neg Hx   ? ? ?Outpatient Encounter Medications as of 02/07/2022  ?Medication Sig  ? aspirin EC 81 MG EC tablet Take 1 tablet (81 mg total) by mouth daily. Swallow whole.  ? blood glucose meter  kit and supplies KIT Dispense based on patient and insurance preference. Use up to four times daily as directed.  ? glipiZIDE (GLUCOTROL) 5 MG tablet Take 1 tablet (5 mg total) by mouth daily before breakfast.  ? metFORMIN (GLUCOPHAGE) 1000 MG tablet Take 1 tablet (1,000 mg total) by mouth 2 (two) times daily with a meal.  ? Multiple Vitamin (MULTIVITAMIN WITH MINERALS) TABS tablet Take 1 tablet by mouth daily.  ? rosuvastatin (CRESTOR) 20 MG tablet Take 1 tablet (20 mg total) by mouth daily.  ? ?No facility-administered encounter medications on file as of 02/07/2022.  ? ? ?ALLERGIES: ?No Known Allergies ? ?VACCINATION STATUS: ?Immunization History  ?Administered Date(s) Administered  ? Pneumococcal Polysaccharide-23 01/20/2017  ? Tdap 10/22/2016  ? ? ?Diabetes ?He presents for his initial diabetic visit. He has type 2 diabetes mellitus. Onset time: diagnosed at approx age of 26. His disease course  has been worsening. There are no hypoglycemic associated symptoms. Associated symptoms include fatigue, polydipsia and polyuria. There are no hypoglycemic complications. Symptoms are stable. There are no diabetic complications. Risk factors for coronary artery disease include diabetes mellitus, dyslipidemia, family history, obesity, male sex and tobacco exposure. Current diabetic treatment includes oral agent (dual therapy). He is compliant with treatment most of the time. His weight is fluctuating minimally. He is following a generally healthy diet. When asked about meal planning, he reported none. He has not had a previous visit with a dietitian. He participates in exercise intermittently. (He presents today for his consultation with no meter or logs to review.  His most recent A1c from 4/11 was 13.9%.  He monitors glucose only once daily and says he knows he could do a better job with consistency.  He drinks water and an occasional soda.  He typically eats 2 meals per day (skipping lunch most days) with some snacks.  He  does not engage in routine physical activity.  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  He is due for eye exam, has not seen podiatry in long time.  He denies any symptoms of hypoglycemia.) An ACE inhibitor/angiotensin II receptor blocker is not being taken. He sees a podiatrist (long time ago).Eye exam is not current.  ?Hyperlipidemia ?This is a chronic problem. The current episode started more than 1 year ago. The problem is uncontrolled. Recent lipid tests were reviewed and are high. Exacerbating diseases include diabetes and obesity. Factors aggravating his hyperlipidemia include fatty foods and smoking. Current antihyperlipidemic treatment includes statins (recently started). Compliance problems include adherence to diet and adherence to exercise.  Risk factors for coronary artery disease include diabetes mellitus, dyslipidemia, family history, obesity and male sex.  ? ? ?Review of systems ? ?Constitutional: + Minimally fluctuating body weight, current Body mass index is 31.06 kg/m?., no fatigue, no subjective hyperthermia, no subjective hypothermia ?Eyes: no blurry vision, no xerophthalmia ?ENT: no sore throat, no nodules palpated in throat, no dysphagia/odynophagia, no hoarseness ?Cardiovascular: no chest pain, no shortness of breath, no palpitations, no leg swelling ?Respiratory: no cough, no shortness of breath ?Gastrointestinal: no nausea/vomiting/diarrhea ?Musculoskeletal: no muscle/joint aches ?Skin: no rashes, no hyperemia ?Neurological: no tremors, no numbness, no tingling, no dizziness ?Psychiatric: no depression, no anxiety ? ?Objective:  ?  ? ?BP (!) 149/70   Pulse 71   Ht 6' (1.829 m)   Wt 229 lb (103.9 kg)   SpO2 99%   BMI 31.06 kg/m?   ?Wt Readings from Last 3 Encounters:  ?02/07/22 229 lb (103.9 kg)  ?01/22/22 234 lb 6.4 oz (106.3 kg)  ?12/14/21 230 lb (104.3 kg)  ?  ? ?BP Readings from Last 3 Encounters:  ?02/07/22 (!) 149/70   ?01/22/22 136/80  ?12/15/21 135/74  ?  ? ?Physical Exam- Limited ? ?Constitutional:  Body mass index is 31.06 kg/m?. , not in acute distress, normal state of mind ?Eyes:  EOMI, no exophthalmos ?Neck: Supple ?Cardiovascular: RRR, no murmurs, rubs, or gallops, no edema ?Respiratory: Adequate breathing efforts, no crackles, rales, rhonchi, or wheezing ?Musculoskeletal: no gross deformities, strength intact in all four extremities, no gross restriction of joint movements ?Skin:  no rashes, no hyperemia ?Neurological: no tremor with outstretched hands ? ? ? ?CMP ( most recent) ?CMP  ?   ?Component Value Date/Time  ? NA 142 01/22/2022 0907  ? K 4.5 01/22/2022 0907  ? CL 102 01/22/2022 0907  ?  CO2 25 01/22/2022 0907  ? GLUCOSE 237 (H) 01/22/2022 3009  ? GLUCOSE 798 [facility]) 12/14/2021 2038  ? BUN 9 01/22/2022 0907  ? CREATININE 0.87 01/22/2022 0907  ? CALCIUM 9.8 01/22/2022 0907  ? PROT 6.9 01/22/2022 0907  ? ALBUMIN 4.4 01/22/2022 0907  ? AST 17 01/22/2022 0907  ? ALT 17 01/22/2022 0907  ? ALKPHOS 83 01/22/2022 0907  ? BILITOT 0.2 01/22/2022 0907  ? GFRNONAA >60 12/14/2021 2038  ? GFRAA 102 04/25/2020 1211  ? ? ? ?Diabetic Labs (most recent): ?Lab Results  ?Component Value Date  ? HGBA1C 13.9 (H) 01/22/2022  ? HGBA1C 8.0 (H) 12/11/2020  ? HGBA1C 7.1 (A) 04/25/2020  ? HGBA1C 7.1 04/25/2020  ? HGBA1C 7.1 (A) 04/25/2020  ? HGBA1C 7.1 (A) 04/25/2020  ? ? ? Lipid Panel ( most recent) ?Lipid Panel  ?   ?Component Value Date/Time  ? CHOL 236 (H) 01/22/2022 2330  ? TRIG 159 (H) 01/22/2022 0762  ? HDL 37 (L) 01/22/2022 2633  ? CHOLHDL 6.4 (H) 01/22/2022 3545  ? [place] 170 (H) 01/22/2022 6256  ? LABVLDL 29 01/22/2022 0907  ? ?  ? ?Lab Results  ?Component Value Date  ? TSH 1.300 04/25/2020  ? TSH 3.780 10/22/2016  ?  ? ?   ? ? ? ?Assessment & Plan:  ? ?1) Type 2 diabetes mellitus without complication, without long-term current use of insulin ([place]) ? ?He presents today for his consultation with no meter or logs to review.  His most  recent A1c from 4/11 was 13.9%.  He monitors glucose only once daily and says he knows he could do a better job with consistency.  He drinks water and an occasional soda.  He typically eats 2 meals per day (ski

## 2022-02-11 ENCOUNTER — Telehealth: Payer: Self-pay | Admitting: *Deleted

## 2022-02-11 MED ORDER — GLUCOSE BLOOD VI STRP: ORAL_STRIP | 11 refills | Status: AC

## 2022-02-11 NOTE — Telephone Encounter (Signed)
Prescription sent electronically to pharmacy. Patient aware. 

## 2022-02-11 NOTE — Telephone Encounter (Signed)
Patient stated he needs a script for one touch testing srips sent to [facility] in [place] on [address] ?Patient stated the machine and lancets were sent in at visit but not testing strips ?

## 2022-02-21 ENCOUNTER — Ambulatory Visit: Payer: BC Managed Care – PPO | Admitting: Family Medicine

## 2022-02-21 ENCOUNTER — Ambulatory Visit: Payer: BC Managed Care – PPO | Admitting: Nurse Practitioner

## 2022-02-21 NOTE — Patient Instructions (Incomplete)
Diabetes Mellitus and Foot Care Foot care is an important part of your health, especially when you have diabetes. Diabetes may cause you to have problems because of poor blood flow (circulation) to your feet and legs, which can cause your skin to: Become thinner and drier. Break more easily. Heal more slowly. Peel and crack. You may also have nerve damage (neuropathy) in your legs and feet, causing decreased feeling in them. This means that you may not notice minor injuries to your feet that could lead to more serious problems. Noticing and addressing any potential problems early is the best way to prevent future foot problems. How to care for your feet Foot hygiene  Wash your feet daily with warm water and mild soap. Do not use hot water. Then, pat your feet and the areas between your toes until they are completely dry. Do not soak your feet as this can dry your skin. Trim your toenails straight across. Do not dig under them or around the cuticle. File the edges of your nails with an emery board or nail file. Apply a moisturizing lotion or petroleum jelly to the skin on your feet and to dry, brittle toenails. Use lotion that does not contain alcohol and is unscented. Do not apply lotion between your toes. Shoes and socks Wear clean socks or stockings every day. Make sure they are not too tight. Do not wear knee-high stockings since they may decrease blood flow to your legs. Wear shoes that fit properly and have enough cushioning. Always look in your shoes before you put them on to be sure there are no objects inside. To break in new shoes, wear them for just a few hours a day. This prevents injuries on your feet. Wounds, scrapes, corns, and calluses  Check your feet daily for blisters, cuts, bruises, sores, and redness. If you cannot see the bottom of your feet, use a mirror or ask someone for help. Do not cut corns or calluses or try to remove them with medicine. If you find a minor scrape,  cut, or break in the skin on your feet, keep it and the skin around it clean and dry. You may clean these areas with mild soap and water. Do not clean the area with peroxide, alcohol, or iodine. If you have a wound, scrape, corn, or callus on your foot, look at it several times a day to make sure it is healing and not infected. Check for: Redness, swelling, or pain. Fluid or blood. Warmth. Pus or a bad smell. General tips Do not cross your legs. This may decrease blood flow to your feet. Do not use heating pads or hot water bottles on your feet. They may burn your skin. If you have lost feeling in your feet or legs, you may not know this is happening until it is too late. Protect your feet from hot and cold by wearing shoes, such as at the beach or on hot pavement. Schedule a complete foot exam at least once a year (annually) or more often if you have foot problems. Report any cuts, sores, or bruises to your health care provider immediately. Where to find more information American Diabetes Association: www.diabetes.org Association of Diabetes Care & Education Specialists: www.diabeteseducator.org Contact a health care provider if: You have a medical condition that increases your risk of infection and you have any cuts, sores, or bruises on your feet. You have an injury that is not healing. You have redness on your legs or feet. You   feel burning or tingling in your legs or feet. You have pain or cramps in your legs and feet. Your legs or feet are numb. Your feet always feel cold. You have pain around any toenails. Get help right away if: You have a wound, scrape, corn, or callus on your foot and: You have pain, swelling, or redness that gets worse. You have fluid or blood coming from the wound, scrape, corn, or callus. Your wound, scrape, corn, or callus feels warm to the touch. You have pus or a bad smell coming from the wound, scrape, corn, or callus. You have a fever. You have a red  line going up your leg. Summary Check your feet every day for blisters, cuts, bruises, sores, and redness. Apply a moisturizing lotion or petroleum jelly to the skin on your feet and to dry, brittle toenails. Wear shoes that fit properly and have enough cushioning. If you have foot problems, report any cuts, sores, or bruises to your health care provider immediately. Schedule a complete foot exam at least once a year (annually) or more often if you have foot problems. This information is not intended to replace advice given to you by your health care provider. Make sure you discuss any questions you have with your health care provider. Document Revised: 04/20/2020 Document Reviewed: 04/20/2020 Elsevier Patient Education  2023 Elsevier Inc.  

## 2022-05-29 ENCOUNTER — Ambulatory Visit (INDEPENDENT_AMBULATORY_CARE_PROVIDER_SITE_OTHER): Payer: BC Managed Care – PPO | Admitting: Family Medicine

## 2022-05-29 VITALS — BP 132/79 | HR 91 | Temp 97.9°F | Ht 72.0 in | Wt 234.0 lb

## 2022-05-29 DIAGNOSIS — Z720 Tobacco use: Secondary | ICD-10-CM

## 2022-05-29 DIAGNOSIS — E1165 Type 2 diabetes mellitus with hyperglycemia: Secondary | ICD-10-CM | POA: Diagnosis not present

## 2022-05-29 DIAGNOSIS — E1169 Type 2 diabetes mellitus with other specified complication: Secondary | ICD-10-CM

## 2022-05-29 DIAGNOSIS — E782 Mixed hyperlipidemia: Secondary | ICD-10-CM | POA: Diagnosis not present

## 2022-05-29 DIAGNOSIS — Z86718 Personal history of other venous thrombosis and embolism: Secondary | ICD-10-CM

## 2022-05-29 DIAGNOSIS — E785 Hyperlipidemia, unspecified: Secondary | ICD-10-CM

## 2022-05-29 NOTE — Progress Notes (Signed)
Subjective:  Patient ID: Cameron James, male    DOB: 24-Jun-1961  Age: 61 y.o. MRN: [id number]  CC: Follow up  HPI:  61 year old male with type 2 diabetes, hyperlipidemia, history of DVT, obesity, tobacco abuse presents for follow-up.  Type 2 diabetes Blood glucose has markedly improved following treatment.  He states that his fasting blood sugars are typically 118-120. Patient is in need of A1c today.  His prior A1c was 13.9. He states that he is no longer having any polyuria polydipsia.  He endorses compliance with metformin 1000 mg twice daily and glipizide 5 mg daily.  He is tolerating the medication without difficulty. No hypoglycemia. No numbness or tingling.  No vision difficulties.  Hyperlipidemia Lipids have been uncontrolled.  Most recent LDL was 170. Patient was started on Crestor and is tolerating well. Needs lipid panel today.  History of DVT Prior history of right lower extremity DVT in 2022.  Was followed by vascular surgery.  Reportedly there was no identifiable cause.  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  He is also a smoker.   He completed treatment and is now on daily aspirin therapy.   Patient Active Problem List   Diagnosis Date Noted   Tobacco abuse 05/29/2022   Hyperlipidemia associated with type 2 diabetes mellitus ([place]) 01/22/2022   Obesity (BMI 30-39.9) 01/22/2022   Uncontrolled type 2 diabetes mellitus with hyperglycemia ([place]) 01/22/2022   History of DVT (deep vein thrombosis) 01/22/2022    Social Hx   Social History   Socioeconomic History   Marital status: Single    Spouse name: Not on file   Number of children: 1   Years of education: Not on file   Highest education level: Not on file  Occupational History   Not on file  Tobacco Use   Smoking status: Every Day    Packs/day: 1.00    Types: Cigarettes   Smokeless tobacco: Never  Vaping Use   Vaping Use: Never used  Substance and Sexual Activity    Alcohol use: Yes    Alcohol/week: 2.0 standard drinks of alcohol    Types: 1 Cans of beer, 1 Shots of liquor per week   Drug use: No   Sexual activity: Not Currently  Other Topics Concern   Not on file  Social History Narrative   Not on file   Social Determinants of Health   Financial Resource Strain: Low Risk  (03/02/2019)   Overall Financial Resource Strain (CARDIA)    Difficulty of Paying Living Expenses: Not hard at all  Food Insecurity: No Food Insecurity (03/02/2019)   Hunger Vital Sign    Worried About Running Out of Food in the Last Year: Never true    Ran Out of Food in the Last Year: Never true  Transportation Needs: No Transportation Needs (03/02/2019)   PRAPARE - Hydrologist (Medical): No    Lack of Transportation (Non-Medical): No  Physical Activity: Inactive (03/02/2019)   Exercise Vital Sign    Days of Exercise per Week: 0 days    Minutes of Exercise per Session: 0 min  Stress: No Stress Concern Present (03/02/2019)   [place]    Feeling of Stress : Only a little  Social Connections: Moderately Integrated (03/02/2019)   Social Connection and Isolation Panel [NHANES]    Frequency of Communication with Friends and Family: Three times a week  Frequency of Social Gatherings with Friends and Family: Three times a week    Attends Religious Services: 1 to 4 times per year    Active Member of Clubs or Organizations: No    Attends Archivist Meetings: Never    Marital Status: Living with partner    Review of Systems Per HPI  Objective:  BP 132/79   Pulse 91   Temp 97.9 F (36.6 C)   Ht 6' (1.829 m)   Wt 234 lb (106.1 kg)   SpO2 98%   BMI 31.74 kg/m      05/29/2022    1:42 PM 02/07/2022    9:42 AM 01/22/2022    8:12 AM  BP/Weight  Systolic BP 170 017 494  Diastolic BP 79 70 80  Wt. (Lbs) 234 229 234.4  BMI 31.74 kg/m2 31.06 kg/m2 30.93 kg/m2     Physical Exam Vitals and nursing note reviewed.  Constitutional:      General: He is not in acute distress.    Appearance: Normal appearance.  HENT:     Head: Normocephalic and atraumatic.  Cardiovascular:     Rate and Rhythm: Normal rate and regular rhythm.  Pulmonary:     Effort: Pulmonary effort is normal.     Breath sounds: Normal breath sounds. No wheezing, rhonchi or rales.  Abdominal:     General: There is no distension.     Palpations: Abdomen is soft.     Tenderness: There is no abdominal tenderness.  Neurological:     Mental Status: He is alert.  Psychiatric:        Mood and Affect: Mood normal.        Behavior: Behavior normal.     Lab Results  Component Value Date   WBC 10.0 01/22/2022   HGB 14.9 01/22/2022   HCT 44.9 01/22/2022   PLT 318 01/22/2022   GLUCOSE 237 (H) 01/22/2022   CHOL 236 (H) 01/22/2022   TRIG 159 (H) 01/22/2022   HDL 37 (L) 01/22/2022   LDLCALC 170 (H) 01/22/2022   ALT 17 01/22/2022   AST 17 01/22/2022   NA 142 01/22/2022   K 4.5 01/22/2022   CL 102 01/22/2022   CREATININE 0.87 01/22/2022   BUN 9 01/22/2022   CO2 25 01/22/2022   TSH 1.300 04/25/2020   INR 1.0 12/11/2020   HGBA1C 13.9 (H) 01/22/2022     Assessment & Plan:   Problem List Items Addressed This Visit       Endocrine   Hyperlipidemia associated with type 2 diabetes mellitus ([place])    Panel today for reevaluation.  Continue Crestor.      Uncontrolled type 2 diabetes mellitus with hyperglycemia ([place]) - Primary    Appears to be improving.  A1c today for further evaluation.  Patient needs forms filled out for DOT. Patient is to continue metformin and glipizide.      Relevant Orders   Hemoglobin A1c     Other   History of DVT (deep vein thrombosis)    Continue daily aspirin therapy as recommended by vascular surgery.      Tobacco abuse    Needs smoking cessation.      Other Visit Diagnoses     Mixed hyperlipidemia       Relevant Orders   Lipid  panel       Follow-up:  Return in about 3 months (around 08/29/2022).  [place]

## 2022-05-29 NOTE — Assessment & Plan Note (Addendum)
Appears to be improving.  A1c today for further evaluation.  Patient needs forms filled out for DOT. Patient is to continue metformin and glipizide.

## 2022-05-29 NOTE — Assessment & Plan Note (Signed)
Panel today for reevaluation.  Continue Crestor.

## 2022-05-29 NOTE — Assessment & Plan Note (Signed)
Continue daily aspirin therapy as recommended by vascular surgery.

## 2022-05-29 NOTE — Patient Instructions (Signed)
Labs today.  I will fill out the forms and either fax them or have you pick them up.  Follow up in 3 months.  Take care  Dr. Lacinda Axon

## 2022-05-29 NOTE — Assessment & Plan Note (Signed)
Needs smoking cessation.

## 2022-05-30 ENCOUNTER — Telehealth: Payer: Self-pay | Admitting: *Deleted

## 2022-05-30 LAB — LIPID PANEL
Chol/HDL Ratio: 4.9 ratio (ref 0.0–5.0)
Cholesterol, Total: 173 mg/dL (ref 100–199)
HDL: 35 mg/dL — ABNORMAL LOW (ref >39)
LDL Chol Calc (NIH): 105 mg/dL — ABNORMAL HIGH (ref 0–99)
Triglycerides: 186 mg/dL — ABNORMAL HIGH (ref 0–149)
VLDL Cholesterol Cal: 33 mg/dL (ref 5–40)

## 2022-05-30 LAB — HEMOGLOBIN A1C
Est. average glucose Bld gHb Est-mCnc: 194 mg/dL
Hgb A1c MFr Bld: 8.4 % — ABNORMAL HIGH (ref 4.8–5.6)

## 2022-05-30 NOTE — Telephone Encounter (Signed)
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

## 2022-05-31 NOTE — Telephone Encounter (Signed)
Notes and labs faxed by medical records

## 2022-05-31 NOTE — Telephone Encounter (Signed)
Thersa Salt G, DO   I filled out the forms which detailed this.  Please fax the notes and labs.   Dr. Lacinda Axon

## 2022-07-02 ENCOUNTER — Telehealth: Payer: Self-pay

## 2022-07-03 NOTE — Telephone Encounter (Signed)
Error

## 2022-08-23 ENCOUNTER — Telehealth: Payer: Self-pay

## 2022-08-23 DIAGNOSIS — E1165 Type 2 diabetes mellitus with hyperglycemia: Secondary | ICD-10-CM

## 2022-08-23 DIAGNOSIS — E782 Mixed hyperlipidemia: Secondary | ICD-10-CM

## 2022-08-23 MED ORDER — METFORMIN HCL 1000 MG PO TABS: 1000.0000 mg | ORAL_TABLET | Freq: Two times a day (BID) | ORAL | 1 refills | Status: DC

## 2022-08-23 MED ORDER — ROSUVASTATIN CALCIUM 20 MG PO TABS: 20.0000 mg | ORAL_TABLET | Freq: Every day | ORAL | 1 refills | Status: DC

## 2022-08-23 MED ORDER — GLIPIZIDE 5 MG PO TABS: 5.0000 mg | ORAL_TABLET | Freq: Every day | ORAL | 1 refills | Status: DC

## 2022-08-23 NOTE — Telephone Encounter (Signed)
Spoke with the patient and sent in medications per request.

## 2022-08-23 NOTE — Telephone Encounter (Signed)
Caller name: Donney Caraveo  On DPR?: Yes  Call back number: [phone] (mobile)  Provider they see: Coral Spikes, DO  Reason for call:Pt started working at [employer] and they are requiring he get his medications through them there was medications that was sent to [facility] that he needs changed over to [facility] on [facility]

## 2022-08-29 ENCOUNTER — Ambulatory Visit: Payer: BC Managed Care – PPO | Admitting: Family Medicine

## 2022-12-18 IMAGING — DX DG CHEST 2V
2 series · 2 of 2 positions shown · non-contrast
Comparison: None.

CLINICAL DATA: Increased weakness over the past 3 weeks

EXAM:
CHEST - 2 VIEW

[chest pa]
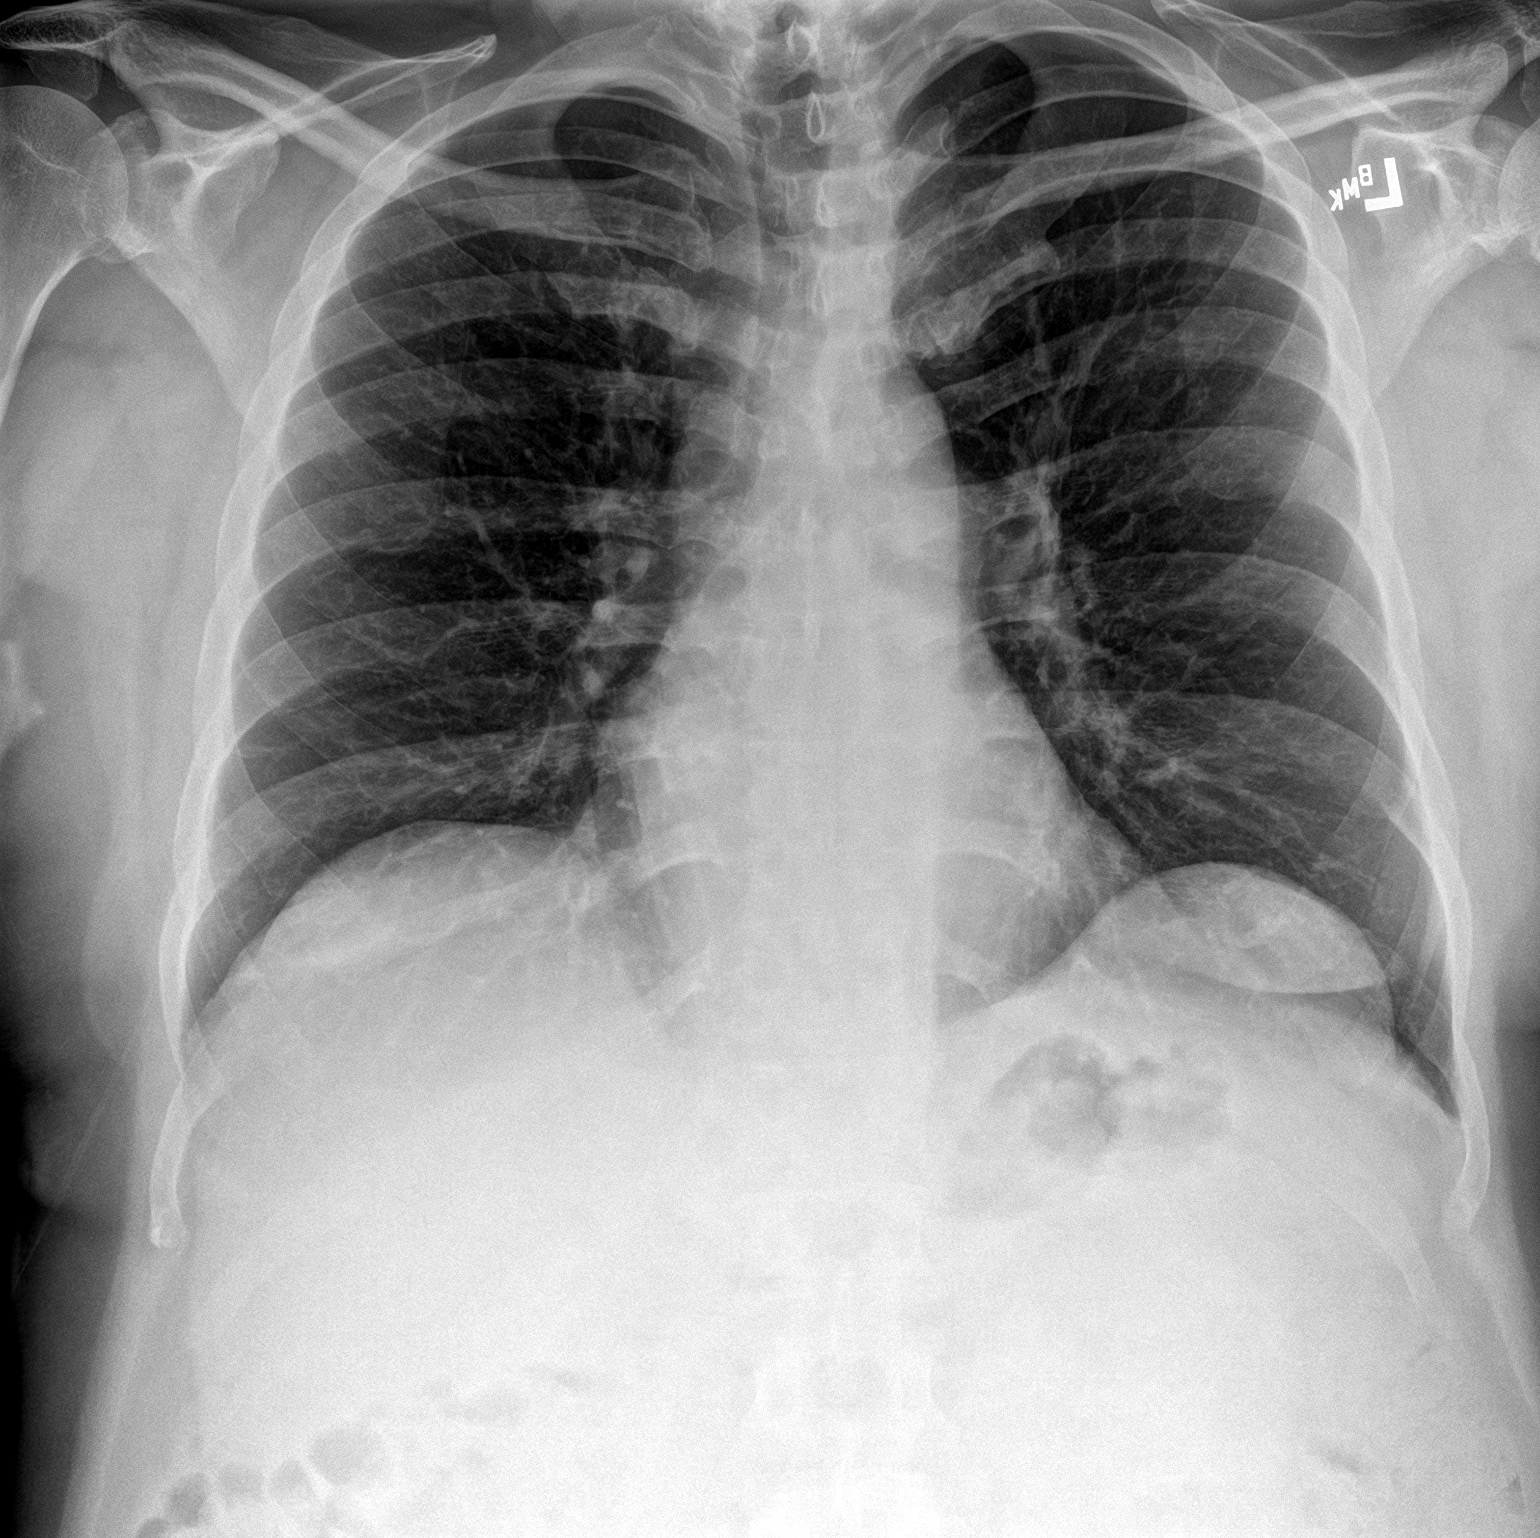

[chest lat]
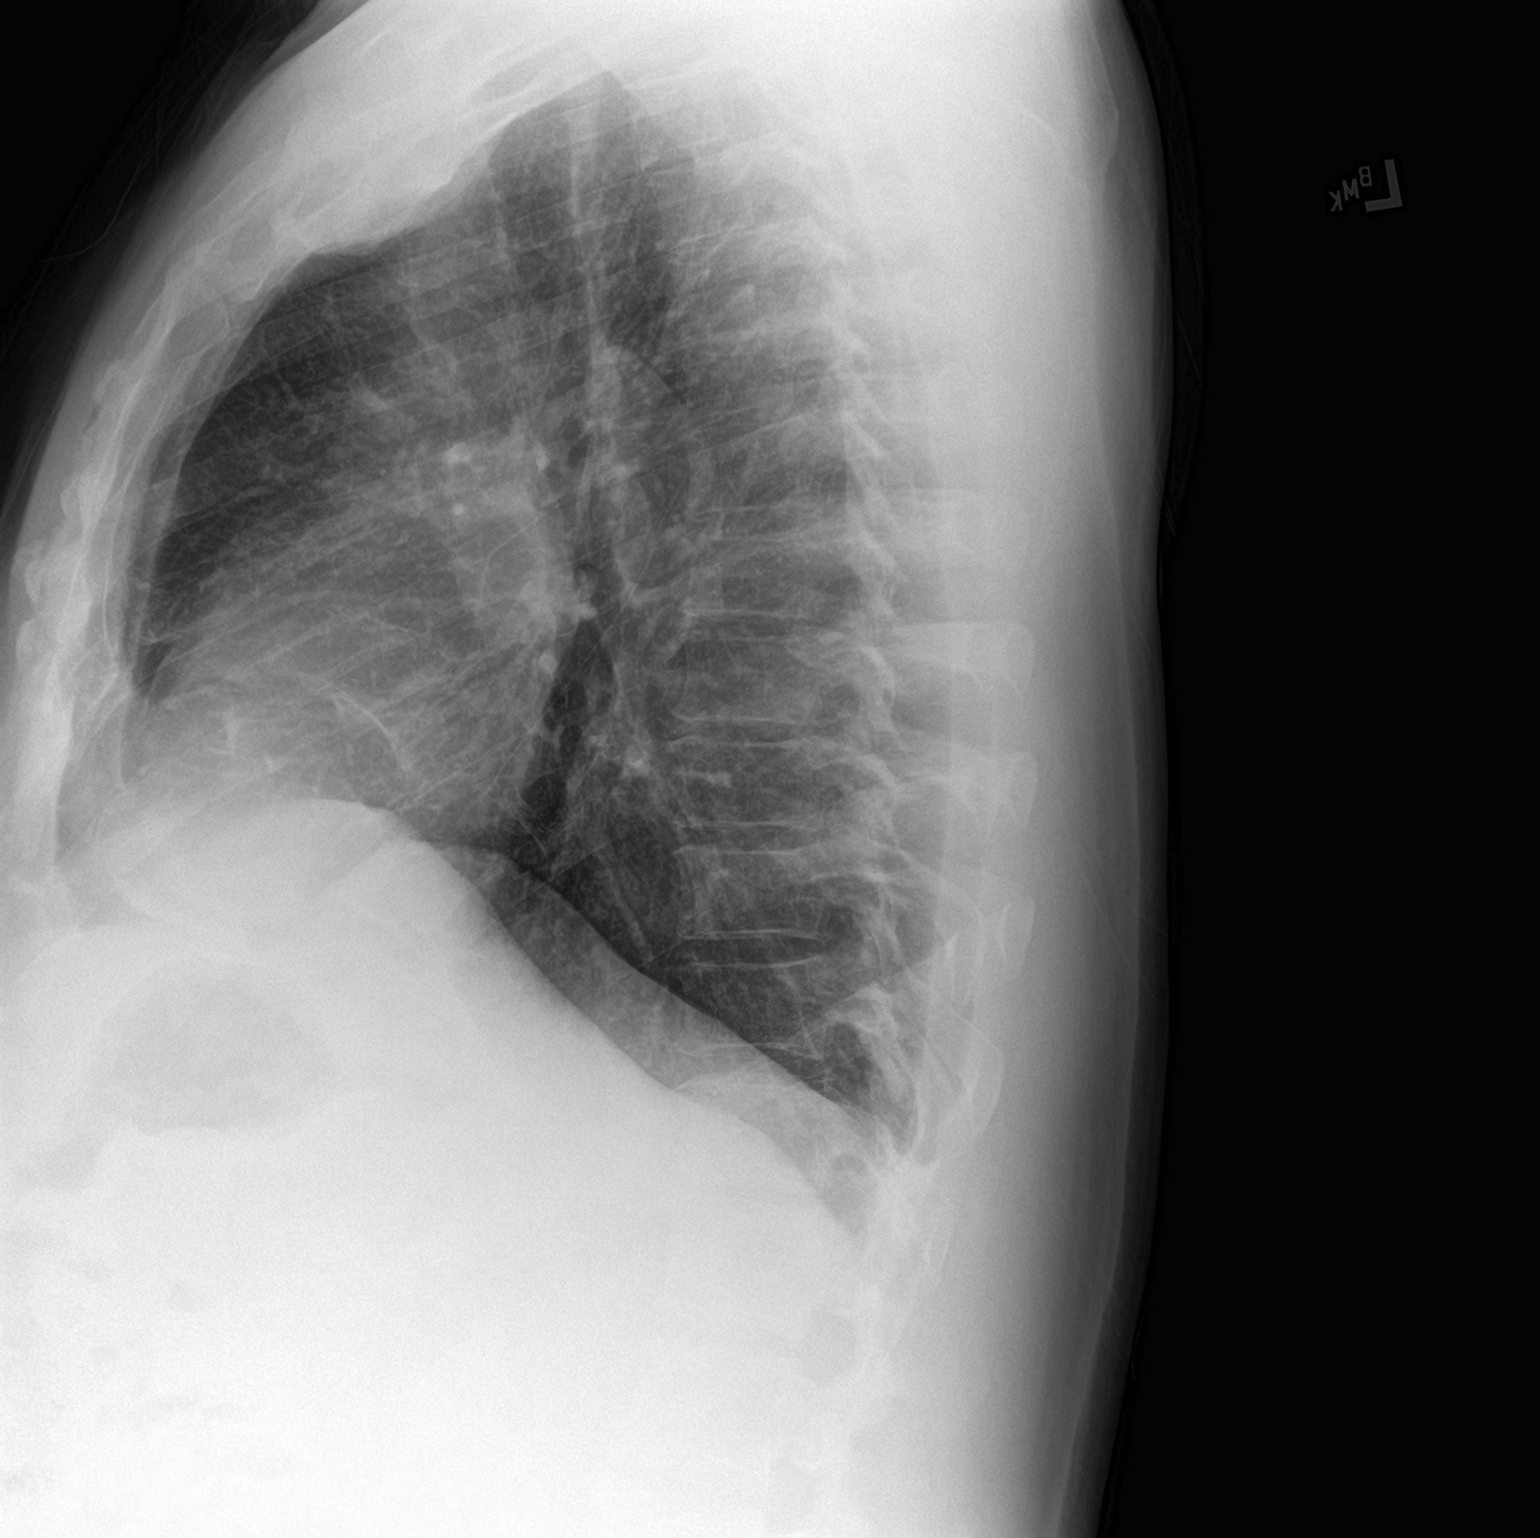

[2 of 2 positions shown; findings below may reference images not displayed]

FINDINGS: The heart size and mediastinal contours are within normal limits.
Both lungs are clear. The visualized skeletal structures are
unremarkable.
IMPRESSION: No active cardiopulmonary disease.

## 2023-03-04 NOTE — Progress Notes (Signed)
[facility] Quality Team Note  Name: Cameron James Date of Birth: [birth year] MRN: [id number] Date: 03/04/2023  [facility] Quality Team has reviewed this patient's chart, please see recommendations below:  [facility] Quality Other; (Called patient to offer [facility] event 03/06/23, left voicemail.)

## 2023-03-31 ENCOUNTER — Telehealth: Payer: Self-pay | Admitting: Family Medicine

## 2023-03-31 DIAGNOSIS — E1165 Type 2 diabetes mellitus with hyperglycemia: Secondary | ICD-10-CM

## 2023-03-31 DIAGNOSIS — E782 Mixed hyperlipidemia: Secondary | ICD-10-CM

## 2023-03-31 MED ORDER — ROSUVASTATIN CALCIUM 20 MG PO TABS: 20.0000 mg | ORAL_TABLET | Freq: Every day | ORAL | 2 refills | Status: DC

## 2023-03-31 MED ORDER — GLIPIZIDE 5 MG PO TABS: 5.0000 mg | ORAL_TABLET | Freq: Every day | ORAL | 2 refills | Status: DC

## 2023-03-31 NOTE — Telephone Encounter (Signed)
Refill on  rosuvastatin (CRESTOR) 20 MG tablet ,   glipiZIDE (GLUCOTROL) 5 MG tablet  Send to [facility] [address] [place]-

## 2023-06-24 ENCOUNTER — Ambulatory Visit (INDEPENDENT_AMBULATORY_CARE_PROVIDER_SITE_OTHER): Payer: BC Managed Care – PPO | Admitting: Family Medicine

## 2023-06-24 ENCOUNTER — Encounter: Payer: Self-pay | Admitting: Family Medicine

## 2023-06-24 VITALS — BP 132/75 | HR 98 | Temp 98.4°F | Ht 73.0 in | Wt 220.6 lb

## 2023-06-24 DIAGNOSIS — E1165 Type 2 diabetes mellitus with hyperglycemia: Secondary | ICD-10-CM | POA: Diagnosis not present

## 2023-06-24 DIAGNOSIS — Z86718 Personal history of other venous thrombosis and embolism: Secondary | ICD-10-CM

## 2023-06-24 DIAGNOSIS — Z125 Encounter for screening for malignant neoplasm of prostate: Secondary | ICD-10-CM

## 2023-06-24 DIAGNOSIS — E785 Hyperlipidemia, unspecified: Secondary | ICD-10-CM

## 2023-06-24 DIAGNOSIS — Z7984 Long term (current) use of oral hypoglycemic drugs: Secondary | ICD-10-CM

## 2023-06-24 DIAGNOSIS — E1169 Type 2 diabetes mellitus with other specified complication: Secondary | ICD-10-CM

## 2023-06-24 DIAGNOSIS — Z720 Tobacco use: Secondary | ICD-10-CM

## 2023-06-24 DIAGNOSIS — Z1211 Encounter for screening for malignant neoplasm of colon: Secondary | ICD-10-CM

## 2023-06-24 MED ORDER — VARENICLINE TARTRATE (STARTER) 0.5 MG X 11 & 1 MG X 42 PO TBPK: ORAL_TABLET | ORAL | 0 refills | Status: DC

## 2023-06-24 MED ORDER — METFORMIN HCL 1000 MG PO TABS: 1000.0000 mg | ORAL_TABLET | Freq: Two times a day (BID) | ORAL | 1 refills | Status: DC

## 2023-06-24 MED ORDER — VARENICLINE TARTRATE 1 MG PO TABS: 1.0000 mg | ORAL_TABLET | Freq: Two times a day (BID) | ORAL | 1 refills | Status: DC

## 2023-06-24 NOTE — Assessment & Plan Note (Signed)
Discussed today.  Trial of Chantix.

## 2023-06-24 NOTE — Assessment & Plan Note (Signed)
Unsure of control.  Continue current medications.  Metformin was refilled.  Awaiting A1c.

## 2023-06-24 NOTE — Patient Instructions (Signed)
Approaches to selecting a tobacco quit date: May either choose a fixed quit date (ie, start varenicline, then quit on day 8) or a flexible quit date (ie, start varenicline, then quit between days 8 to 35). Alternatively, a gradual quit date (ie, start varenicline and reduce smoking 50% by week 4, reduce an additional 50% by week 8, and continue reducing with a goal of complete abstinence by week 12) is acceptable.  Labs ordered.  Follow up in 3 months.

## 2023-06-24 NOTE — Assessment & Plan Note (Signed)
Unsure of control.  Continue Crestor.  Awaiting lipid panel.

## 2023-06-24 NOTE — Progress Notes (Signed)
Subjective:  Patient ID: Cameron James, male    DOB: 1961/01/24  Age: 62 y.o. MRN: [id number]  CC:  Follow up  HPI:  62 year old male with uncontrolled type 2 diabetes, hyperlipidemia, tobacco abuse, history of DVT presents for follow-up.  Patient has not been seen since last year.  Unsure of diabetic control.  He is currently on glipizide and metformin.  Needs A1c in addition to his other routine labs.  Blood pressure is well-controlled.   Unsure the status of his lipids.  He is on Crestor.  Patient states that he continues to smoke.  He is interested in smoking cessation.  Will discuss options today.   Patient Active Problem List   Diagnosis Date Noted   Tobacco abuse 05/29/2022   Hyperlipidemia associated with type 2 diabetes mellitus ([facility]) 01/22/2022   Uncontrolled type 2 diabetes mellitus with hyperglycemia ([facility]) 01/22/2022   History of DVT (deep vein thrombosis) 01/22/2022    Social Hx   Social History   Socioeconomic History   Marital status: Single    Spouse name: Not on file   Number of children: 1   Years of education: Not on file   Highest education level: Not on file  Occupational History   Not on file  Tobacco Use   Smoking status: Every Day    Current packs/day: 1.00    Types: Cigarettes   Smokeless tobacco: Never  Vaping Use   Vaping status: Never Used  Substance and Sexual Activity   Alcohol use: Yes    Alcohol/week: 2.0 standard drinks of alcohol    Types: 1 Cans of beer, 1 Shots of liquor per week   Drug use: No   Sexual activity: Not Currently  Other Topics Concern   Not on file  Social History Narrative   Not on file   Social Determinants of Health   Financial Resource Strain: Low Risk  (03/02/2019)   Overall Financial Resource Strain (CARDIA)    Difficulty of Paying Living Expenses: Not hard at all  Food Insecurity: No Food Insecurity (03/02/2019)   Hunger Vital Sign    Worried About Running Out of Food in the Last Year: Never true     Ran Out of Food in the Last Year: Never true  Transportation Needs: No Transportation Needs (03/02/2019)   PRAPARE - Administrator, Civil Service (Medical): No    Lack of Transportation (Non-Medical): No  Physical Activity: Inactive (03/02/2019)   Exercise Vital Sign    Days of Exercise per Week: 0 days    Minutes of Exercise per Session: 0 min  Stress: No Stress Concern Present (03/02/2019)   [employer] of Occupational Health - Occupational Stress Questionnaire    Feeling of Stress : Only a little  Social Connections: Unknown (02/23/2022)   Received from [facility]   Social Network    Social Network: Not on file    Review of Systems  Constitutional: Negative.   Respiratory: Negative.    Cardiovascular: Negative.    Objective:  BP 132/75   Pulse 98   Temp 98.4 F (36.9 C) (Oral)   Ht 6\' 1"  (1.854 m)   Wt 220 lb 9.6 oz (100.1 kg)   SpO2 96%   BMI 29.10 kg/m      06/24/2023    2:20 PM 05/29/2022    1:42 PM 02/07/2022    9:42 AM  BP/Weight  Systolic BP 132 132 149  Diastolic BP 75 79 70  Wt. (Lbs) 220.6 234 229  BMI 29.1 kg/m2 31.74 kg/m2 31.06 kg/m2    Physical Exam Vitals reviewed.  Constitutional:      General: He is not in acute distress.    Appearance: Normal appearance.  HENT:     Head: Normocephalic and atraumatic.  Eyes:     General:        Right eye: No discharge.        Left eye: No discharge.     Conjunctiva/sclera: Conjunctivae normal.  Cardiovascular:     Rate and Rhythm: Normal rate and regular rhythm.  Pulmonary:     Effort: Pulmonary effort is normal.     Breath sounds: Normal breath sounds. No wheezing, rhonchi or rales.  Neurological:     Mental Status: He is alert.  Psychiatric:        Mood and Affect: Mood normal.        Behavior: Behavior normal.     Lab Results  Component Value Date   WBC 10.0 01/22/2022   HGB 14.9 01/22/2022   HCT 44.9 01/22/2022   PLT 318 01/22/2022   GLUCOSE 237 (H)  01/22/2022   CHOL 173 05/29/2022   TRIG 186 (H) 05/29/2022   HDL 35 (L) 05/29/2022   LDLCALC 105 (H) 05/29/2022   ALT 17 01/22/2022   AST 17 01/22/2022   NA 142 01/22/2022   K 4.5 01/22/2022   CL 102 01/22/2022   CREATININE 0.87 01/22/2022   BUN 9 01/22/2022   CO2 25 01/22/2022   TSH 1.300 04/25/2020   INR 1.0 12/11/2020   HGBA1C 8.4 (H) 05/29/2022     Assessment & Plan:   Problem List Items Addressed This Visit       Endocrine   Uncontrolled type 2 diabetes mellitus with hyperglycemia ([facility]) - Primary    Unsure of control.  Continue current medications.  Metformin was refilled.  Awaiting A1c.      Relevant Medications   metFORMIN (GLUCOPHAGE) 1000 MG tablet   Other Relevant Orders   CMP14+EGFR   Hemoglobin A1c   Microalbumin / creatinine urine ratio   Hyperlipidemia associated with type 2 diabetes mellitus ([facility])    Unsure of control.  Continue Crestor.  Awaiting lipid panel.      Relevant Medications   metFORMIN (GLUCOPHAGE) 1000 MG tablet   Other Relevant Orders   Lipid panel     Other   History of DVT (deep vein thrombosis)   Relevant Orders   CBC   Tobacco abuse    Discussed today.  Trial of Chantix.      Other Visit Diagnoses     Encounter for screening colonoscopy       Relevant Orders   Ambulatory referral to Gastroenterology   Screening PSA (prostate specific antigen)       Relevant Orders   PSA       Meds ordered this encounter  Medications   Varenicline Tartrate, Starter, (CHANTIX STARTING MONTH PAK) 0.5 MG X 11 & 1 MG X 42 TBPK    Sig: Days 1 to 3: 0.5 mg once daily. Days 4 to 7: 0.5 mg twice daily. Maintenance (day 8 and later): 1 mg twice daily.    Dispense:  53 each    Refill:  0   varenicline (CHANTIX) 1 MG tablet    Sig: Take 1 tablet (1 mg total) by mouth 2 (two) times daily. To be started after starter pack.    Dispense:  180 tablet    Refill:  1   metFORMIN (GLUCOPHAGE) 1000 MG tablet    Sig: Take 1 tablet (1,000 mg total)  by mouth 2 (two) times daily with a meal.    Dispense:  180 tablet    Refill:  1    Follow-up:  Return in about 3 months (around 09/23/2023).  Everlene Other DO [facility]

## 2023-06-25 DIAGNOSIS — E1169 Type 2 diabetes mellitus with other specified complication: Secondary | ICD-10-CM | POA: Diagnosis not present

## 2023-06-25 DIAGNOSIS — E1165 Type 2 diabetes mellitus with hyperglycemia: Secondary | ICD-10-CM | POA: Diagnosis not present

## 2023-06-25 DIAGNOSIS — Z125 Encounter for screening for malignant neoplasm of prostate: Secondary | ICD-10-CM | POA: Diagnosis not present

## 2023-06-25 DIAGNOSIS — E785 Hyperlipidemia, unspecified: Secondary | ICD-10-CM | POA: Diagnosis not present

## 2023-06-25 DIAGNOSIS — Z86718 Personal history of other venous thrombosis and embolism: Secondary | ICD-10-CM | POA: Diagnosis not present

## 2023-06-26 ENCOUNTER — Encounter (INDEPENDENT_AMBULATORY_CARE_PROVIDER_SITE_OTHER): Payer: Self-pay | Admitting: *Deleted

## 2023-06-26 LAB — CBC
Hematocrit: 48.7 % (ref 37.5–51.0)
Hemoglobin: 16 g/dL (ref 13.0–17.7)
MCH: 29.3 pg (ref 26.6–33.0)
MCHC: 32.9 g/dL (ref 31.5–35.7)
MCV: 89 fL (ref 79–97)
Platelets: 244 10*3/uL (ref 150–450)
RBC: 5.46 x10E6/uL (ref 4.14–5.80)
RDW: 12 % (ref 11.6–15.4)
WBC: 9.7 10*3/uL (ref 3.4–10.8)

## 2023-06-26 LAB — CMP14+EGFR
ALT: 20 IU/L (ref 0–44)
AST: 18 IU/L (ref 0–40)
Albumin: 4.5 g/dL (ref 3.8–4.9)
Alkaline Phosphatase: 80 IU/L (ref 44–121)
BUN/Creatinine Ratio: 13 (ref 10–24)
BUN: 12 mg/dL (ref 8–27)
Bilirubin Total: 0.4 mg/dL (ref 0.0–1.2)
CO2: 21 mmol/L (ref 20–29)
Calcium: 9.8 mg/dL (ref 8.6–10.2)
Chloride: 101 mmol/L (ref 96–106)
Creatinine, Ser: 0.96 mg/dL (ref 0.76–1.27)
Globulin, Total: 2.5 g/dL (ref 1.5–4.5)
Glucose: 174 mg/dL — ABNORMAL HIGH (ref 70–99)
Potassium: 4.2 mmol/L (ref 3.5–5.2)
Sodium: 139 mmol/L (ref 134–144)
Total Protein: 7 g/dL (ref 6.0–8.5)
eGFR: 90 mL/min/{1.73_m2} (ref >59)

## 2023-06-26 LAB — MICROALBUMIN / CREATININE URINE RATIO
Creatinine, Urine: 217 mg/dL
Microalb/Creat Ratio: 9 mg/g{creat} (ref 0–29)
Microalbumin, Urine: 19.3 ug/mL

## 2023-06-26 LAB — HEMOGLOBIN A1C
Est. average glucose Bld gHb Est-mCnc: 183 mg/dL
Hgb A1c MFr Bld: 8 % — ABNORMAL HIGH (ref 4.8–5.6)

## 2023-06-26 LAB — LIPID PANEL
Chol/HDL Ratio: 3.9 ratio (ref 0.0–5.0)
Cholesterol, Total: 147 mg/dL (ref 100–199)
HDL: 38 mg/dL — ABNORMAL LOW (ref >39)
LDL Chol Calc (NIH): 91 mg/dL (ref 0–99)
Triglycerides: 98 mg/dL (ref 0–149)
VLDL Cholesterol Cal: 18 mg/dL (ref 5–40)

## 2023-06-26 LAB — PSA: Prostate Specific Ag, Serum: 4.5 ng/mL — ABNORMAL HIGH (ref 0.0–4.0)

## 2023-06-30 ENCOUNTER — Telehealth: Payer: Self-pay | Admitting: *Deleted

## 2023-06-30 ENCOUNTER — Telehealth: Payer: Self-pay | Admitting: Family Medicine

## 2023-06-30 NOTE — Telephone Encounter (Signed)
Results of labs. 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

## 2023-06-30 NOTE — Telephone Encounter (Signed)
Tommie Sams, DO     Would he like to try wellbutrin?

## 2023-06-30 NOTE — Telephone Encounter (Signed)
PA for Chantix starter pack denied bu insurance. Denial reason- Plan exclusion- not covered by plan

## 2023-07-01 ENCOUNTER — Other Ambulatory Visit: Payer: Self-pay

## 2023-07-01 ENCOUNTER — Other Ambulatory Visit: Payer: Self-pay | Admitting: Family Medicine

## 2023-07-01 DIAGNOSIS — R972 Elevated prostate specific antigen [PSA]: Secondary | ICD-10-CM

## 2023-07-01 DIAGNOSIS — E782 Mixed hyperlipidemia: Secondary | ICD-10-CM

## 2023-07-01 DIAGNOSIS — E1165 Type 2 diabetes mellitus with hyperglycemia: Secondary | ICD-10-CM

## 2023-07-01 MED ORDER — EMPAGLIFLOZIN 10 MG PO TABS: 10.0000 mg | ORAL_TABLET | Freq: Every day | ORAL | 3 refills | Status: DC

## 2023-07-01 MED ORDER — ROSUVASTATIN CALCIUM 40 MG PO TABS: 40.0000 mg | ORAL_TABLET | Freq: Every day | ORAL | 3 refills | Status: DC

## 2023-07-01 NOTE — Telephone Encounter (Signed)
Results given to patient--see result note.

## 2023-07-02 NOTE — Telephone Encounter (Signed)
Patient states he would like to try wellbutrin for smoking cessation , please send to [facility] on [address] in [place]

## 2023-07-03 ENCOUNTER — Other Ambulatory Visit: Payer: Self-pay | Admitting: Family Medicine

## 2023-07-03 MED ORDER — BUPROPION HCL ER (SR) 150 MG PO TB12: 150.0000 mg | ORAL_TABLET | Freq: Two times a day (BID) | ORAL | 1 refills | Status: DC

## 2023-09-23 ENCOUNTER — Ambulatory Visit (INDEPENDENT_AMBULATORY_CARE_PROVIDER_SITE_OTHER): Payer: BC Managed Care – PPO | Admitting: Family Medicine

## 2023-09-23 VITALS — BP 107/67 | HR 97 | Temp 97.3°F | Ht 73.0 in | Wt 211.0 lb

## 2023-09-23 DIAGNOSIS — E785 Hyperlipidemia, unspecified: Secondary | ICD-10-CM

## 2023-09-23 DIAGNOSIS — E1165 Type 2 diabetes mellitus with hyperglycemia: Secondary | ICD-10-CM | POA: Diagnosis not present

## 2023-09-23 DIAGNOSIS — Z7984 Long term (current) use of oral hypoglycemic drugs: Secondary | ICD-10-CM

## 2023-09-23 DIAGNOSIS — E1169 Type 2 diabetes mellitus with other specified complication: Secondary | ICD-10-CM

## 2023-09-23 NOTE — Patient Instructions (Signed)
Labs later this week (on or after 12/12).  Start Cameron James.  Follow up in 3 months.

## 2023-09-24 MED ORDER — EMPAGLIFLOZIN 10 MG PO TABS: 10.0000 mg | ORAL_TABLET | Freq: Every day | ORAL | 3 refills | Status: DC

## 2023-09-24 MED ORDER — FREESTYLE LIBRE 3 SENSOR MISC: 3 refills | Status: AC

## 2023-09-24 NOTE — Progress Notes (Signed)
Subjective:  Patient ID: Cameron James, male    DOB: Jun 28, 1961  Age: 62 y.o. MRN: [id number]  CC: Follow-up   HPI:  62 year old male with uncontrolled type 2 diabetes, hyperlipidemia, tobacco abuse presents for follow-up.  Patient did not start Jardiance due to cost.  Will discuss this today.  He is currently on metformin and glipizide.  Last A1c 8.0.  Patient interested in CGM.  Last LDL 91.  Crestor was increased.  Tolerating.  Denies chest pain or shortness of breath.  Patient Active Problem List   Diagnosis Date Noted   Tobacco abuse 05/29/2022   Hyperlipidemia associated with type 2 diabetes mellitus (HCC) 01/22/2022   Uncontrolled type 2 diabetes mellitus with hyperglycemia (HCC) 01/22/2022   History of DVT (deep vein thrombosis) 01/22/2022    Social Hx   Social History   Socioeconomic History   Marital status: Single    Spouse name: Not on file   Number of children: 1   Years of education: Not on file   Highest education level: Not on file  Occupational History   Not on file  Tobacco Use   Smoking status: Every Day    Current packs/day: 1.00    Types: Cigarettes   Smokeless tobacco: Never  Vaping Use   Vaping status: Never Used  Substance and Sexual Activity   Alcohol use: Yes    Alcohol/week: 2.0 standard drinks of alcohol    Types: 1 Cans of beer, 1 Shots of liquor per week   Drug use: No   Sexual activity: Not Currently  Other Topics Concern   Not on file  Social History Narrative   Not on file   Social Determinants of Health   Financial Resource Strain: Low Risk  (03/02/2019)   Overall Financial Resource Strain (CARDIA)    Difficulty of Paying Living Expenses: Not hard at all  Food Insecurity: No Food Insecurity (03/02/2019)   Hunger Vital Sign    Worried About Running Out of Food in the Last Year: Never true    Ran Out of Food in the Last Year: Never true  Transportation Needs: No Transportation Needs (03/02/2019)   PRAPARE - Therapist, art (Medical): No    Lack of Transportation (Non-Medical): No  Physical Activity: Inactive (03/02/2019)   Exercise Vital Sign    Days of Exercise per Week: 0 days    Minutes of Exercise per Session: 0 min  Stress: No Stress Concern Present (03/02/2019)   [employer] of Occupational Health - Occupational Stress Questionnaire    Feeling of Stress : Only a little  Social Connections: Unknown (02/23/2022)   [facility] from [facility]   Social Network    Social Network: Not on file    Review of Systems Per HPI  Objective:  BP 107/67   Pulse 97   Temp (!) 97.3 F (36.3 C)   Ht 6\' 1"  (1.854 m)   Wt 211 lb (95.7 kg)   SpO2 98%   BMI 27.84 kg/m      09/23/2023    2:08 PM 06/24/2023    2:20 PM 05/29/2022    1:42 PM  BP/Weight  Systolic BP 107 132 132  Diastolic BP 67 75 79  Wt. (Lbs) 211 220.6 234  BMI 27.84 kg/m2 29.1 kg/m2 31.74 kg/m2    Physical Exam Constitutional:      General: He is not in acute distress.    Appearance: Normal appearance.  HENT:  Head: Normocephalic and atraumatic.  Cardiovascular:     Rate and Rhythm: Normal rate and regular rhythm.  Pulmonary:     Effort: Pulmonary effort is normal.     Breath sounds: Normal breath sounds. No wheezing, rhonchi or rales.  Neurological:     Mental Status: He is alert.  Psychiatric:        Mood and Affect: Mood normal.        Behavior: Behavior normal.     Lab Results  Component Value Date   WBC 9.7 06/25/2023   HGB 16.0 06/25/2023   HCT 48.7 06/25/2023   PLT 244 06/25/2023   GLUCOSE 174 (H) 06/25/2023   CHOL 147 06/25/2023   TRIG 98 06/25/2023   HDL 38 (L) 06/25/2023   LDLCALC 91 06/25/2023   ALT 20 06/25/2023   AST 18 06/25/2023   NA 139 06/25/2023   K 4.2 06/25/2023   CL 101 06/25/2023   CREATININE 0.96 06/25/2023   BUN 12 06/25/2023   CO2 21 06/25/2023   TSH 1.300 04/25/2020   INR 1.0 12/11/2020   HGBA1C 8.0 (H) 06/25/2023     Assessment &  Plan:   Problem List Items Addressed This Visit       Endocrine   Hyperlipidemia associated with type 2 diabetes mellitus (HCC)    Goal LDL less than 70.  Lipid panel ordered.  Continue Crestor.      Relevant Medications   empagliflozin (JARDIANCE) 10 MG TABS tablet   Other Relevant Orders   Lipid panel   Uncontrolled type 2 diabetes mellitus with hyperglycemia (HCC) - Primary    Lengthy discussion today about treatment options and the reason that Jardiance was chosen.  He states that he will give it a try. A1c ordered.  Will see if we can get CGM covered by his insurance.      Relevant Medications   empagliflozin (JARDIANCE) 10 MG TABS tablet   Other Relevant Orders   Hemoglobin A1c    Meds ordered this encounter  Medications   empagliflozin (JARDIANCE) 10 MG TABS tablet    Sig: Take 1 tablet (10 mg total) by mouth daily before breakfast.    Dispense:  30 tablet    Refill:  3   Continuous Glucose Sensor (FREESTYLE LIBRE 3 SENSOR) MISC    Sig: Place 1 sensor on the skin every 14 days. Use to check glucose continuously    Dispense:  2 each    Refill:  3    Follow-up:  Return in about 3 months (around 12/22/2023).  Everlene Other DO [facility]

## 2023-09-24 NOTE — Assessment & Plan Note (Signed)
Goal LDL less than 70.  Lipid panel ordered.  Continue Crestor.

## 2023-09-24 NOTE — Assessment & Plan Note (Addendum)
Lengthy discussion today about treatment options and the reason that Cameron James was chosen.  He states that he will give it a try. A1c ordered.  Will see if we can get CGM covered by his insurance.

## 2023-12-11 ENCOUNTER — Encounter: Payer: Self-pay | Admitting: Gastroenterology

## 2023-12-23 ENCOUNTER — Ambulatory Visit: Payer: BC Managed Care – PPO | Admitting: Family Medicine

## 2023-12-25 ENCOUNTER — Encounter (INDEPENDENT_AMBULATORY_CARE_PROVIDER_SITE_OTHER): Payer: Self-pay | Admitting: *Deleted

## 2024-06-21 ENCOUNTER — Encounter: Payer: Self-pay | Admitting: Family Medicine

## 2024-06-21 ENCOUNTER — Ambulatory Visit (INDEPENDENT_AMBULATORY_CARE_PROVIDER_SITE_OTHER): Admitting: Family Medicine

## 2024-06-21 VITALS — BP 122/80 | HR 80 | Ht 73.0 in | Wt 207.0 lb

## 2024-06-21 DIAGNOSIS — E1169 Type 2 diabetes mellitus with other specified complication: Secondary | ICD-10-CM | POA: Diagnosis not present

## 2024-06-21 DIAGNOSIS — Z130 Encounter for screening for diseases of the blood and blood-forming organs and certain disorders involving the immune mechanism: Secondary | ICD-10-CM | POA: Diagnosis not present

## 2024-06-21 DIAGNOSIS — E1165 Type 2 diabetes mellitus with hyperglycemia: Secondary | ICD-10-CM | POA: Diagnosis not present

## 2024-06-21 DIAGNOSIS — R972 Elevated prostate specific antigen [PSA]: Secondary | ICD-10-CM

## 2024-06-21 DIAGNOSIS — E785 Hyperlipidemia, unspecified: Secondary | ICD-10-CM

## 2024-06-21 NOTE — Assessment & Plan Note (Signed)
 Unsure of control. A1c to assess.

## 2024-06-21 NOTE — Assessment & Plan Note (Signed)
 A1c to assess. Unsure of control.

## 2024-06-21 NOTE — Progress Notes (Signed)
 Subjective:  Patient ID: Cameron James, male    DOB: Mar 11, 1961  Age: 63 y.o. MRN: [id number]  CC:  Follow up   HPI:  63 year old male presents for follow up.  Patient is not currently taking any prescription drugs.  He states that his typical blood sugars are in the 150s.  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  Blood pressure well-controlled.  Continues to smoke.  Patient requesting labs today.   Patient Active Problem List   Diagnosis Date Noted   Tobacco abuse 05/29/2022   Hyperlipidemia associated with type 2 diabetes mellitus (HCC) 01/22/2022   Uncontrolled type 2 diabetes mellitus with hyperglycemia (HCC) 01/22/2022   History of DVT (deep vein thrombosis) 01/22/2022    Social Hx   Social History   Socioeconomic History   Marital status: Single    Spouse name: Not on file   Number of children: 1   Years of education: Not on file   Highest education level: Not on file  Occupational History   Not on file  Tobacco Use   Smoking status: Every Day    Current packs/day: 1.00    Types: Cigarettes   Smokeless tobacco: Never  Vaping Use   Vaping status: Never Used  Substance and Sexual Activity   Alcohol use: Yes    Alcohol/week: 2.0 standard drinks of alcohol    Types: 1 Cans of beer, 1 Shots of liquor per week   Drug use: No   Sexual activity: Not Currently  Other Topics Concern   Not on file  Social History Narrative   Not on file   Social Drivers of Health   Financial Resource Strain: Low Risk  (03/02/2019)   Overall Financial Resource Strain (CARDIA)    Difficulty of Paying Living Expenses: Not hard at all  Food Insecurity: No Food Insecurity (03/02/2019)   Hunger Vital Sign    Worried About Running Out of Food in the Last Year: Never true    Ran Out of Food in the Last Year: Never true  Transportation Needs: No Transportation Needs (03/02/2019)   PRAPARE - Administrator, Civil Service (Medical): No    Lack of  Transportation (Non-Medical): No  Physical Activity: Inactive (03/02/2019)   Exercise Vital Sign    Days of Exercise per Week: 0 days    Minutes of Exercise per Session: 0 min  Stress: No Stress Concern Present (03/02/2019)   [employer] of Occupational Health - Occupational Stress Questionnaire    Feeling of Stress : Only a little  Social Connections: Unknown (02/23/2022)   Received from [facility]   Social Network    Social Network: Not on file    Review of Systems  Respiratory: Negative.    Cardiovascular: Negative.    Objective:  BP 122/80   Pulse 80   Ht 6' 1 (1.854 m)   Wt 207 lb (93.9 kg)   SpO2 100%   BMI 27.31 kg/m      06/21/2024   10:17 AM 09/23/2023    2:08 PM 06/24/2023    2:20 PM  BP/Weight  Systolic BP 122 107 132  Diastolic BP 80 67 75  Wt. (Lbs) 207 211 220.6  BMI 27.31 kg/m2 27.84 kg/m2 29.1 kg/m2    Physical Exam Vitals and nursing note reviewed.  Constitutional:      General: He is not in acute distress.    Appearance: Normal appearance.  HENT:  Head: Normocephalic and atraumatic.  Cardiovascular:     Rate and Rhythm: Normal rate and regular rhythm.  Pulmonary:     Effort: Pulmonary effort is normal.     Breath sounds: Normal breath sounds. No wheezing, rhonchi or rales.  Neurological:     Mental Status: He is alert.  Psychiatric:        Mood and Affect: Mood normal.        Behavior: Behavior normal.     Lab Results  Component Value Date   WBC 9.7 06/25/2023   HGB 16.0 06/25/2023   HCT 48.7 06/25/2023   PLT 244 06/25/2023   GLUCOSE 174 (H) 06/25/2023   CHOL 147 06/25/2023   TRIG 98 06/25/2023   HDL 38 (L) 06/25/2023   LDLCALC 91 06/25/2023   ALT 20 06/25/2023   AST 18 06/25/2023   NA 139 06/25/2023   K 4.2 06/25/2023   CL 101 06/25/2023   CREATININE 0.96 06/25/2023   BUN 12 06/25/2023   CO2 21 06/25/2023   TSH 1.300 04/25/2020   INR 1.0 12/11/2020   HGBA1C 8.0 (H) 06/25/2023     Assessment & Plan:   Uncontrolled type 2 diabetes mellitus with hyperglycemia (HCC) Assessment & Plan: A1c to assess. Unsure of control.   Orders: -     CMP14+EGFR -     Hemoglobin A1c -     Microalbumin / creatinine urine ratio  Hyperlipidemia associated with type 2 diabetes mellitus (HCC) Assessment & Plan: Unsure of control. A1c to assess.   Orders: -     Lipid panel  Elevated PSA -     PSA  Screening for deficiency anemia -     CBC    Follow-up:  Return in about 3 months (around 09/20/2024).  Jacqulyn Ahle DO [facility]

## 2024-06-21 NOTE — Patient Instructions (Signed)
Labs today.  Follow up in 3 months.  Take care  Dr. Breckyn Ticas  

## 2024-06-22 ENCOUNTER — Ambulatory Visit: Payer: Self-pay | Admitting: Family Medicine

## 2024-06-22 LAB — CMP14+EGFR
ALT: 19 IU/L (ref 0–44)
AST: 18 IU/L (ref 0–40)
Albumin: 4.4 g/dL (ref 3.9–4.9)
Alkaline Phosphatase: 99 IU/L (ref 44–121)
BUN/Creatinine Ratio: 13 (ref 10–24)
BUN: 12 mg/dL (ref 8–27)
Bilirubin Total: 0.3 mg/dL (ref 0.0–1.2)
CO2: 21 mmol/L (ref 20–29)
Calcium: 9.9 mg/dL (ref 8.6–10.2)
Chloride: 101 mmol/L (ref 96–106)
Creatinine, Ser: 0.89 mg/dL (ref 0.76–1.27)
Globulin, Total: 2.4 g/dL (ref 1.5–4.5)
Glucose: 279 mg/dL — ABNORMAL HIGH (ref 70–99)
Potassium: 4.3 mmol/L (ref 3.5–5.2)
Sodium: 136 mmol/L (ref 134–144)
Total Protein: 6.8 g/dL (ref 6.0–8.5)
eGFR: 97 mL/min/1.73 (ref >59)

## 2024-06-22 LAB — MICROALBUMIN / CREATININE URINE RATIO
Creatinine, Urine: 108.4 mg/dL
Microalb/Creat Ratio: 8 mg/g{creat} (ref 0–29)
Microalbumin, Urine: 8.3 ug/mL

## 2024-06-22 LAB — CBC
Hematocrit: 49 % (ref 37.5–51.0)
Hemoglobin: 16.3 g/dL (ref 13.0–17.7)
MCH: 29.9 pg (ref 26.6–33.0)
MCHC: 33.3 g/dL (ref 31.5–35.7)
MCV: 90 fL (ref 79–97)
Platelets: 234 x10E3/uL (ref 150–450)
RBC: 5.45 x10E6/uL (ref 4.14–5.80)
RDW: 12.8 % (ref 11.6–15.4)
WBC: 10 x10E3/uL (ref 3.4–10.8)

## 2024-06-22 LAB — LIPID PANEL
Chol/HDL Ratio: 5.4 ratio — ABNORMAL HIGH (ref 0.0–5.0)
Cholesterol, Total: 220 mg/dL — ABNORMAL HIGH (ref 100–199)
HDL: 41 mg/dL (ref >39)
LDL Chol Calc (NIH): 160 mg/dL — ABNORMAL HIGH (ref 0–99)
Triglycerides: 105 mg/dL (ref 0–149)
VLDL Cholesterol Cal: 19 mg/dL (ref 5–40)

## 2024-06-22 LAB — HEMOGLOBIN A1C
Est. average glucose Bld gHb Est-mCnc: 324 mg/dL
Hgb A1c MFr Bld: 12.9 % — ABNORMAL HIGH (ref 4.8–5.6)

## 2024-06-22 LAB — PSA: Prostate Specific Ag, Serum: 4 ng/mL (ref 0.0–4.0)

## 2024-06-28 NOTE — Telephone Encounter (Signed)
 Left message for patient to call back to discuss results and recommendations.  Ok for E2C2 to discuss results and respond with patient need.

## 2024-06-28 NOTE — Telephone Encounter (Signed)
-----   Message from Jacqulyn KANDICE Ahle sent at 06/22/2024 10:23 PM EDT ----- Lipids uncontrolled. A1c uncontrolled. Will likely need insulin  therapy. Recommend referral to Endo. Recommend starting statin for uncontrolled lipids. ----- Message ----- From: Interface, [facility] Lab Results In Sent: 06/22/2024   7:38 AM EDT To: Jayce G Cook, DO

## 2024-06-29 NOTE — Telephone Encounter (Signed)
-----   Message from Jacqulyn KANDICE Ahle sent at 06/22/2024 10:23 PM EDT ----- Lipids uncontrolled. A1c uncontrolled. Will likely need insulin  therapy. Recommend referral to Endo. Recommend starting statin for uncontrolled lipids. ----- Message ----- From: Interface, [facility] Lab Results In Sent: 06/22/2024   7:38 AM EDT To: Jayce G Cook, DO

## 2024-06-29 NOTE — Telephone Encounter (Signed)
 Left message for patient to schedule an appointment to discuss concerns.  OK for E2C2 to schedule.

## 2024-07-01 ENCOUNTER — Telehealth: Payer: Self-pay | Admitting: Family Medicine

## 2024-07-01 NOTE — Telephone Encounter (Unsigned)
 Copied from CRM [id number]. Topic: Clinical - Medical Advice >> Jul 01, 2024  9:56 AM Donee H wrote: Reason for CRM: Patient called requesting if it's possible to have statin prescribed to him. Lipids and A1c uncontrolled.   Patient requesting a callback regarding request. [phone]

## 2024-07-02 ENCOUNTER — Other Ambulatory Visit: Payer: Self-pay | Admitting: Family Medicine

## 2024-07-02 MED ORDER — ATORVASTATIN CALCIUM 40 MG PO TABS: 40.0000 mg | ORAL_TABLET | Freq: Every day | ORAL | 3 refills | Status: DC

## 2024-07-06 ENCOUNTER — Ambulatory Visit: Admitting: Family Medicine

## 2024-07-06 VITALS — BP 120/70 | HR 102 | Ht 73.0 in | Wt 207.0 lb

## 2024-07-06 DIAGNOSIS — E1165 Type 2 diabetes mellitus with hyperglycemia: Secondary | ICD-10-CM

## 2024-07-06 DIAGNOSIS — Z7984 Long term (current) use of oral hypoglycemic drugs: Secondary | ICD-10-CM

## 2024-07-06 MED ORDER — METFORMIN HCL 500 MG PO TABS: 500.0000 mg | ORAL_TABLET | Freq: Two times a day (BID) | ORAL | 3 refills | Status: AC

## 2024-07-06 MED ORDER — FREESTYLE LIBRE 3 PLUS SENSOR MISC: 1 refills | Status: AC

## 2024-07-06 NOTE — Patient Instructions (Signed)
 Medication as prescribed.  Follow up in 1-3 months.

## 2024-07-06 NOTE — Progress Notes (Signed)
 Subjective:  Patient ID: Cameron James, male    DOB: Nov 21, 1960  Age: 63 y.o. MRN: [id number]  CC:  Follow up   HPI:  63 year old male presents to discuss treatment options for uncontrolled DM-2.  A1c returned at 12.9. He is not currently on treatment. He would like to discuss medication options as well as lifestyle interventions.  Lipids also uncontrolled. Statin has already been sent in.  Patient Active Problem List   Diagnosis Date Noted   Tobacco abuse 05/29/2022   Hyperlipidemia associated with type 2 diabetes mellitus (HCC) 01/22/2022   Uncontrolled type 2 diabetes mellitus with hyperglycemia (HCC) 01/22/2022   History of DVT (deep vein thrombosis) 01/22/2022    Social Hx   Social History   Socioeconomic History   Marital status: Single    Spouse name: Not on file   Number of children: 1   Years of education: Not on file   Highest education level: Not on file  Occupational History   Not on file  Tobacco Use   Smoking status: Every Day    Current packs/day: 1.00    Types: Cigarettes   Smokeless tobacco: Never  Vaping Use   Vaping status: Never Used  Substance and Sexual Activity   Alcohol use: Yes    Alcohol/week: 2.0 standard drinks of alcohol    Types: 1 Cans of beer, 1 Shots of liquor per week   Drug use: No   Sexual activity: Not Currently  Other Topics Concern   Not on file  Social History Narrative   Not on file   Social Drivers of Health   Financial Resource Strain: Low Risk  (03/02/2019)   Overall Financial Resource Strain (CARDIA)    Difficulty of Paying Living Expenses: Not hard at all  Food Insecurity: No Food Insecurity (03/02/2019)   Hunger Vital Sign    Worried About Running Out of Food in the Last Year: Never true    Ran Out of Food in the Last Year: Never true  Transportation Needs: No Transportation Needs (03/02/2019)   PRAPARE - Administrator, Civil Service (Medical): No    Lack of Transportation (Non-Medical): No   Physical Activity: Inactive (03/02/2019)   Exercise Vital Sign    Days of Exercise per Week: 0 days    Minutes of Exercise per Session: 0 min  Stress: No Stress Concern Present (03/02/2019)   [employer] of Occupational Health - Occupational Stress Questionnaire    Feeling of Stress : Only a little  Social Connections: Unknown (02/23/2022)   Received from [facility]   Social Network    Social Network: Not on file    Review of Systems  Constitutional: Negative.   Respiratory: Negative.    Cardiovascular: Negative.    Objective:  BP 120/70   Pulse (!) 102   Ht 6' 1 (1.854 m)   Wt 207 lb (93.9 kg)   SpO2 99%   BMI 27.31 kg/m      07/06/2024    8:38 AM 06/21/2024   10:17 AM 09/23/2023    2:08 PM  BP/Weight  Systolic BP 120 122 107  Diastolic BP 70 80 67  Wt. (Lbs) 207 207 211  BMI 27.31 kg/m2 27.31 kg/m2 27.84 kg/m2    Physical Exam Vitals and nursing note reviewed.  Constitutional:      General: He is not in acute distress.    Appearance: Normal appearance.  HENT:     Head: Normocephalic and atraumatic.  Eyes:  General:        Right eye: No discharge.        Left eye: No discharge.     Conjunctiva/sclera: Conjunctivae normal.  Pulmonary:     Effort: Pulmonary effort is normal. No respiratory distress.  Neurological:     Mental Status: He is alert.     Lab Results  Component Value Date   WBC 10.0 06/21/2024   HGB 16.3 06/21/2024   HCT 49.0 06/21/2024   PLT 234 06/21/2024   GLUCOSE 279 (H) 06/21/2024   CHOL 220 (H) 06/21/2024   TRIG 105 06/21/2024   HDL 41 06/21/2024   LDLCALC 160 (H) 06/21/2024   ALT 19 06/21/2024   AST 18 06/21/2024   NA 136 06/21/2024   K 4.3 06/21/2024   CL 101 06/21/2024   CREATININE 0.89 06/21/2024   BUN 12 06/21/2024   CO2 21 06/21/2024   TSH 1.300 04/25/2020   INR 1.0 12/11/2020   HGBA1C 12.9 (H) 06/21/2024     Assessment & Plan:  Uncontrolled type 2 diabetes mellitus with hyperglycemia (HCC) Assessment  & Plan: Uncontrolled and worsening. A1c now 12.9. Discussed treatment options today - Insulin , SGLT2, GLP1, DPP4, Sulfonylureas, Metformin , diet/lifestyle interventions, referral to Endo.  Rx sent in for [facility]. Patient elected to start just Metformin  at this time. Rx sent in.  Orders: -     metFORMIN  HCl; Take 1 tablet (500 mg total) by mouth 2 (two) times daily with a meal. After 1 week increase to 1000 mg in the morning and 500 mg in the evening. After an additional week, increase to 1000 mg twice a day.  Dispense: 180 tablet; Refill: 3 -     FreeStyle Libre 3 Plus Sensor; Change sensor every 15 days.  Dispense: 6 each; Refill: 1    Follow-up:  1-3 months  Emilya Justen Bluford DO [facility]

## 2024-07-06 NOTE — Assessment & Plan Note (Signed)
 Uncontrolled and worsening. A1c now 12.9. Discussed treatment options today - Insulin , SGLT2, GLP1, DPP4, Sulfonylureas, Metformin , diet/lifestyle interventions, referral to Endo.  Rx sent in for [facility]. Patient elected to start just Metformin  at this time. Rx sent in.

## 2024-07-12 ENCOUNTER — Telehealth: Payer: Self-pay

## 2024-07-12 NOTE — Telephone Encounter (Signed)
 Copied from CRM #[id number]. Topic: Clinical - Prescription Issue >> Jul 12, 2024 11:46 AM Tiffany B wrote: Reason for CRM:   Patient would like atorvastatin  (LIPITOR) 40 MG tablet sent to   [facility] [address], [place] - [address]  Phone: [phone] Fax: [phone]  Patient no longer uses the [facility] in [place],

## 2024-07-13 MED ORDER — ATORVASTATIN CALCIUM 40 MG PO TABS: 40.0000 mg | ORAL_TABLET | Freq: Every day | ORAL | 3 refills | Status: AC

## 2024-07-13 NOTE — Telephone Encounter (Signed)
 Updated script sent.

## 2024-08-24 ENCOUNTER — Telehealth: Payer: Self-pay | Admitting: Family Medicine

## 2024-08-24 NOTE — Telephone Encounter (Unsigned)
 Copied from CRM [id number]. Topic: General - Other >> Aug 24, 2024  3:29 PM Alfonso HERO wrote: Reason for CRM: Val from [employer] called to see if documents for Rx change request that was faxed over on 11/6 were received. She is asking for a callback at [phone] Ref# [id number]

## 2024-09-20 ENCOUNTER — Ambulatory Visit: Admitting: Family Medicine

## 2024-09-22 ENCOUNTER — Telehealth: Payer: Self-pay | Admitting: Family Medicine

## 2024-09-22 NOTE — Telephone Encounter (Signed)
 Copied from CRM [id number]. Topic: General - Other >> Aug 24, 2024  3:29 PM Alfonso HERO wrote: Reason for CRM: Val from [employer] called to see if documents for Rx change request that was faxed over on 11/6 were received. She is asking for a callback at [phone] Ref# [id number]

## 2024-11-03 ENCOUNTER — Other Ambulatory Visit: Payer: Self-pay | Admitting: Family Medicine

## 2024-11-03 DIAGNOSIS — E1165 Type 2 diabetes mellitus with hyperglycemia: Secondary | ICD-10-CM

## 2024-11-04 ENCOUNTER — Ambulatory Visit (INDEPENDENT_AMBULATORY_CARE_PROVIDER_SITE_OTHER): Payer: Self-pay | Admitting: Family Medicine

## 2024-11-04 VITALS — BP 121/74 | HR 111 | Temp 97.9°F | Ht 73.0 in | Wt 215.4 lb

## 2024-11-04 DIAGNOSIS — Z7984 Long term (current) use of oral hypoglycemic drugs: Secondary | ICD-10-CM

## 2024-11-04 DIAGNOSIS — E1165 Type 2 diabetes mellitus with hyperglycemia: Secondary | ICD-10-CM | POA: Diagnosis not present

## 2024-11-04 MED ORDER — INSULIN GLARGINE 100 UNIT/ML SOLOSTAR PEN: 15.0000 [IU] | PEN_INJECTOR | Freq: Every day | SUBCUTANEOUS | 1 refills | Status: AC

## 2024-11-04 MED ORDER — INSULIN PEN NEEDLE 32G X 4 MM MISC: 2 refills | Status: AC

## 2024-11-04 NOTE — Patient Instructions (Signed)
 15 units daily.  Increase by 1 unit daily until blood sugar is below 150.  Follow up in 2 weeks.  Take care  Dr. Bluford

## 2024-11-08 ENCOUNTER — Telehealth: Payer: Self-pay

## 2024-11-08 NOTE — Assessment & Plan Note (Signed)
 Uncontrolled.  Lengthy discussion today. Starting Lantus . Advised to increase Metformin .  Referring to pharmacy. Follow up in 2 weeks.

## 2024-11-08 NOTE — Progress Notes (Signed)
 Care Guide Pharmacy Note  11/08/2024 Name: Tamarion Haymond MRN: [id number] DOB: 1961/07/31  Referred By: Cook, Jayce G, DO Reason for referral: Complex Care Management (Outreach to schedule with Pharm d )   Brendon Christoffel is a 63 y.o. year old male who is a primary care patient of Cook, Jayce G, DO.  Rondey Fallen was referred to the pharmacist for assistance related to: DMII  An unsuccessful telephone outreach was attempted today to contact the patient who was referred to the pharmacy team for assistance with medication management. Additional attempts will be made to contact the patient.  Jeoffrey Buffalo , RMA     [facility]  [facility]  Direct Dial: [phone]  Website: [email or web address]

## 2024-11-08 NOTE — Progress Notes (Signed)
 "  Subjective:  Patient ID: Cameron James, male    DOB: 08-31-1961  Age: 64 y.o. MRN: [id number]  CC:   Chief Complaint  Patient presents with   Hemoglobin A1c Screening    PT needs to lower A1C b4 March 6th  Diabetic dr's booked out past due date    HPI:  64 year old male presents to discuss treatment of uncontrolled DM-2.   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 Last A1c 12.9.  We have previously discussed treatment options and he elected to just proceed with Metformin . He presents today to discuss aggressive treatment to get his blood sugars under control.  Could not see Endo until March.  Patient Active Problem List   Diagnosis Date Noted   Tobacco abuse 05/29/2022   Hyperlipidemia associated with type 2 diabetes mellitus (HCC) 01/22/2022   Uncontrolled type 2 diabetes mellitus with hyperglycemia (HCC) 01/22/2022   History of DVT (deep vein thrombosis) 01/22/2022    Social Hx   Social History   Socioeconomic History   Marital status: Single    Spouse name: Not on file   Number of children: 1   Years of education: Not on file   Highest education level: Not on file  Occupational History   Not on file  Tobacco Use   Smoking status: Every Day    Current packs/day: 1.00    Types: Cigarettes   Smokeless tobacco: Never  Vaping Use   Vaping status: Never Used  Substance and Sexual Activity   Alcohol use: Yes    Alcohol/week: 2.0 standard drinks of alcohol    Types: 1 Cans of beer, 1 Shots of liquor per week   Drug use: No   Sexual activity: Not Currently  Other Topics Concern   Not on file  Social History Narrative   Not on file   Social Drivers of Health   Tobacco Use: High Risk (06/21/2024)   Patient History    Smoking Tobacco Use: Every Day    Smokeless Tobacco Use: Never    Passive Exposure: Not on file  Financial Resource Strain: Not on file  Food Insecurity: Not on file  Transportation Needs: Not on file  Physical Activity: Not on file  Stress: Not on  file  Social Connections: Not on file  Depression (PHQ2-9): Low Risk (11/04/2024)   Depression (PHQ2-9)    PHQ-2 Score: 1  Alcohol Screen: Not on file  Housing: Not on file  Utilities: Not on file  Health Literacy: Not on file    Review of Systems Per HPI  Objective:  BP 121/74   Pulse (!) 111   Temp 97.9 F (36.6 C)   Ht 6' 1 (1.854 m)   Wt 215 lb 6.4 oz (97.7 kg)   SpO2 98%   BMI 28.42 kg/m      11/04/2024    3:25 PM 07/06/2024    8:38 AM 06/21/2024   10:17 AM  BP/Weight  Systolic BP 121 120 122  Diastolic BP 74 70 80  Wt. (Lbs) 215.4 207 207  BMI 28.42 kg/m2 27.31 kg/m2 27.31 kg/m2    Physical Exam Vitals and nursing note reviewed.  Constitutional:      Appearance: Normal appearance.  HENT:     Head: Normocephalic and atraumatic.  Pulmonary:     Effort: Pulmonary effort is normal. No respiratory distress.  Neurological:     Mental Status: He is alert.  Psychiatric:        Mood and Affect: Mood  normal.        Behavior: Behavior normal.     Lab Results  Component Value Date   WBC 10.0 06/21/2024   HGB 16.3 06/21/2024   HCT 49.0 06/21/2024   PLT 234 06/21/2024   GLUCOSE 279 (H) 06/21/2024   CHOL 220 (H) 06/21/2024   TRIG 105 06/21/2024   HDL 41 06/21/2024   LDLCALC 160 (H) 06/21/2024   ALT 19 06/21/2024   AST 18 06/21/2024   NA 136 06/21/2024   K 4.3 06/21/2024   CL 101 06/21/2024   CREATININE 0.89 06/21/2024   BUN 12 06/21/2024   CO2 21 06/21/2024   TSH 1.300 04/25/2020   INR 1.0 12/11/2020   HGBA1C 12.9 (H) 06/21/2024     Assessment & Plan:  Uncontrolled type 2 diabetes mellitus with hyperglycemia (HCC) Assessment & Plan: Uncontrolled.  Lengthy discussion today. Starting Lantus . Advised to increase Metformin .  Referring to pharmacy. Follow up in 2 weeks.  Orders: -     Insulin  Glargine; Inject 15 Units into the skin daily.  Dispense: 15 mL; Refill: 1 -     Insulin  Pen Needle; Use daily to administer insulin .  Dispense: 100 each;  Refill: 2 -     AMB Referral [facility]BCI Care Management    Follow-up:  2 weeks  Sayde Lish Bluford DO [facility] "

## 2024-11-18 ENCOUNTER — Ambulatory Visit: Admitting: Family Medicine

## 2024-11-18 VITALS — BP 128/81 | HR 105 | Temp 95.2°F | Ht 73.0 in | Wt 210.6 lb

## 2024-11-18 DIAGNOSIS — E1165 Type 2 diabetes mellitus with hyperglycemia: Secondary | ICD-10-CM

## 2024-11-18 NOTE — Patient Instructions (Addendum)
 Have [facility] fax paper work - [phone]  Start the insulin .  Follow up in 1 month.

## 2024-11-18 NOTE — Assessment & Plan Note (Addendum)
 Advised again to start insulin  therapy.  Needs close follow-up. I will reach out to my pharmacist to see if she can help answer any additional questions he may have after starting insulin .

## 2024-11-18 NOTE — Progress Notes (Signed)
 "  Subjective:  Patient ID: Cameron James, male    DOB: 09/17/1961  Age: 64 y.o. MRN: [id number]  CC:   Chief Complaint  Patient presents with   FMLA Forms    Pt needs short term disability filled out    HPI:  64 year old male presents for follow-up.  This visit was scheduled for follow-up regarding his type 2 diabetes.  Last A1c 12.9.  His diabetes is highly uncontrolled.  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  At his last visit I started insulin .  He has not started the medication.  He states that he has been reluctant to do so.  He is taking metformin .  I also placed a referral to pharmacy.  They called 3 times and were unable to reach him.  Will discuss treatment recommendations again today.  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  In need of FMLA regarding this.  I advised him to send the forms in to our office.  I am not sure if FMLA will be approved but I am happy to try.  Patient Active Problem List   Diagnosis Date Noted   Tobacco abuse 05/29/2022   Hyperlipidemia associated with type 2 diabetes mellitus (HCC) 01/22/2022   Uncontrolled type 2 diabetes mellitus with hyperglycemia (HCC) 01/22/2022   History of DVT (deep vein thrombosis) 01/22/2022    Social Hx   Social History   Socioeconomic History   Marital status: Single    Spouse name: Not on file   Number of children: 1   Years of education: Not on file   Highest education level: Not on file  Occupational History   Not on file  Tobacco Use   Smoking status: Every Day    Current packs/day: 1.00    Types: Cigarettes   Smokeless tobacco: Never  Vaping Use   Vaping status: Never Used  Substance and Sexual Activity   Alcohol use: Yes    Alcohol/week: 2.0 standard drinks of alcohol    Types: 1 Cans of beer, 1 Shots of liquor per week   Drug use: No   Sexual activity: Not Currently  Other Topics Concern   Not on file  Social History Narrative    Not on file   Social Drivers of Health   Tobacco Use: High Risk (06/21/2024)   Patient History    Smoking Tobacco Use: Every Day    Smokeless Tobacco Use: Never    Passive Exposure: Not on file  Financial Resource Strain: Not on file  Food Insecurity: Not on file  Transportation Needs: Not on file  Physical Activity: Not on file  Stress: Not on file  Social Connections: Not on file  Depression (PHQ2-9): Low Risk (11/18/2024)   Depression (PHQ2-9)    PHQ-2 Score: 1  Alcohol Screen: Not on file  Housing: Not on file  Utilities: Not on file  Health Literacy: Not on file    Review of Systems Per HPI  Objective:  BP 128/81   Pulse (!) 105   Temp (!) 95.2 F (35.1 C)   Ht 6' 1 (1.854 m)   Wt 210 lb 9.6 oz (95.5 kg)   SpO2 97%   BMI 27.79 kg/m      11/18/2024    8:49 AM 11/04/2024    3:25 PM 07/06/2024    8:38 AM  BP/Weight  Systolic BP 128 121 120  Diastolic BP 81 74 70  Wt. (Lbs) 210.6 215.4 207  BMI 27.79 kg/m2 28.42 kg/m2 27.31 kg/m2    Physical Exam Vitals and nursing note reviewed.  Constitutional:      General: He is not in acute distress. HENT:     Head: Normocephalic and atraumatic.  Pulmonary:     Effort: Pulmonary effort is normal. No respiratory distress.  Neurological:     Mental Status: He is alert.  Psychiatric:        Mood and Affect: Mood normal.        Behavior: Behavior normal.     Lab Results  Component Value Date   WBC 10.0 06/21/2024   HGB 16.3 06/21/2024   HCT 49.0 06/21/2024   PLT 234 06/21/2024   GLUCOSE 279 (H) 06/21/2024   CHOL 220 (H) 06/21/2024   TRIG 105 06/21/2024   HDL 41 06/21/2024   LDLCALC 160 (H) 06/21/2024   ALT 19 06/21/2024   AST 18 06/21/2024   NA 136 06/21/2024   K 4.3 06/21/2024   CL 101 06/21/2024   CREATININE 0.89 06/21/2024   BUN 12 06/21/2024   CO2 21 06/21/2024   TSH 1.300 04/25/2020   INR 1.0 12/11/2020   HGBA1C 12.9 (H) 06/21/2024     Assessment & Plan:  Uncontrolled type 2 diabetes mellitus  with hyperglycemia (HCC) Assessment & Plan: Advised again to start insulin  therapy.  Needs close follow-up. I will reach out to my pharmacist to see if she can help answer any additional questions he may have after starting insulin .     Follow-up: 1 month  Siriyah Ambrosius DO [facility] "

## 2024-11-19 ENCOUNTER — Telehealth: Payer: Self-pay | Admitting: Family Medicine

## 2024-11-19 NOTE — Telephone Encounter (Signed)
 Patient had FMLA faxed over to be completed in your box up front and he states hasn't gone back to work yet.

## 2024-11-19 NOTE — Telephone Encounter (Signed)
---   spoke with pt and gave complete instructions on how to administer lantus  inj into the abdomen 10 units today as instructed by provider. Pt verbalized understanding  Copied from CRM #[id number]. Topic: Clinical - Medication Question >> Nov 19, 2024  4:04 PM Leah C wrote: Reason for CRM: Pt Has a question about insulin  pen, how to go about using it and administering his dosage tonight.   [phone] (M)

## 2024-11-29 ENCOUNTER — Other Ambulatory Visit

## 2024-12-27 ENCOUNTER — Ambulatory Visit: Admitting: "Endocrinology
